# Patient Record
Sex: Male | Born: 1960 | Race: White | Hispanic: No | Marital: Married | Smoking: Current every day smoker
Health system: Southern US, Community
[De-identification: ages and names within clinical notes are randomized; demographics above are authoritative.]

## PROBLEM LIST (undated history)

## (undated) DIAGNOSIS — I10 Essential (primary) hypertension: Secondary | ICD-10-CM

## (undated) DIAGNOSIS — G8929 Other chronic pain: Secondary | ICD-10-CM

## (undated) DIAGNOSIS — F329 Major depressive disorder, single episode, unspecified: Secondary | ICD-10-CM

## (undated) DIAGNOSIS — G4700 Insomnia, unspecified: Secondary | ICD-10-CM

## (undated) DIAGNOSIS — E119 Type 2 diabetes mellitus without complications: Secondary | ICD-10-CM

## (undated) DIAGNOSIS — E785 Hyperlipidemia, unspecified: Secondary | ICD-10-CM

## (undated) DIAGNOSIS — M1990 Unspecified osteoarthritis, unspecified site: Secondary | ICD-10-CM

## (undated) DIAGNOSIS — Z8739 Personal history of other diseases of the musculoskeletal system and connective tissue: Secondary | ICD-10-CM

## (undated) DIAGNOSIS — F32A Depression, unspecified: Secondary | ICD-10-CM

## (undated) DIAGNOSIS — M549 Dorsalgia, unspecified: Secondary | ICD-10-CM

## (undated) HISTORY — PX: BACK SURGERY: SHX140

## (undated) HISTORY — PX: OTHER SURGICAL HISTORY: SHX169

---

## 2004-12-30 ENCOUNTER — Emergency Department: Payer: Self-pay | Admitting: Emergency Medicine

## 2011-07-09 ENCOUNTER — Emergency Department: Payer: Self-pay | Admitting: Emergency Medicine

## 2011-08-10 ENCOUNTER — Ambulatory Visit: Payer: Self-pay | Admitting: Unknown Physician Specialty

## 2011-08-23 ENCOUNTER — Ambulatory Visit: Payer: Self-pay | Admitting: Unknown Physician Specialty

## 2011-08-23 DIAGNOSIS — I10 Essential (primary) hypertension: Secondary | ICD-10-CM

## 2011-08-23 LAB — BASIC METABOLIC PANEL
Calcium, Total: 9 mg/dL (ref 8.5–10.1)
Co2: 29 mmol/L (ref 21–32)
EGFR (African American): >60
Potassium: 3 mmol/L — ABNORMAL LOW (ref 3.5–5.1)
Sodium: 142 mmol/L (ref 136–145)

## 2011-08-23 LAB — CBC
HCT: 48.4 % (ref 40.0–52.0)
MCH: 31.4 pg (ref 26.0–34.0)
MCHC: 33.6 g/dL (ref 32.0–36.0)
MCV: 94 fL (ref 80–100)
Platelet: 335 10*3/uL (ref 150–440)
RDW: 13.9 % (ref 11.5–14.5)
WBC: 12.2 10*3/uL — ABNORMAL HIGH (ref 3.8–10.6)

## 2011-08-23 LAB — URINALYSIS, COMPLETE
Bacteria: NONE SEEN
Nitrite: NEGATIVE
Specific Gravity: 1.004 (ref 1.003–1.030)
Squamous Epithelial: NONE SEEN
WBC UR: NONE SEEN /HPF (ref 0–5)

## 2011-08-30 ENCOUNTER — Ambulatory Visit: Payer: Self-pay | Admitting: Unknown Physician Specialty

## 2011-08-30 LAB — POTASSIUM: Potassium: 3.4 mmol/L — ABNORMAL LOW (ref 3.5–5.1)

## 2011-10-23 ENCOUNTER — Ambulatory Visit: Payer: Self-pay | Admitting: Unknown Physician Specialty

## 2012-11-09 ENCOUNTER — Ambulatory Visit: Payer: Self-pay | Admitting: Family Medicine

## 2012-11-13 ENCOUNTER — Ambulatory Visit: Payer: Self-pay | Admitting: Family Medicine

## 2012-11-13 LAB — BASIC METABOLIC PANEL
Anion Gap: 6 — ABNORMAL LOW (ref 7–16)
BUN: 9 mg/dL (ref 7–18)
EGFR (African American): >60
EGFR (Non-African Amer.): >60
Osmolality: 278 (ref 275–301)
Potassium: 3.8 mmol/L (ref 3.5–5.1)

## 2012-11-13 LAB — LIPID PANEL
Cholesterol: 142 mg/dL (ref 0–200)
Ldl Cholesterol, Calc: 74 mg/dL (ref 0–100)

## 2012-11-13 LAB — HEMOGLOBIN A1C: Hemoglobin A1C: 10.2 % — ABNORMAL HIGH (ref 4.2–6.3)

## 2012-11-17 ENCOUNTER — Ambulatory Visit: Payer: Self-pay | Admitting: Family Medicine

## 2012-12-10 ENCOUNTER — Other Ambulatory Visit: Payer: Self-pay | Admitting: Neurological Surgery

## 2012-12-10 DIAGNOSIS — M48061 Spinal stenosis, lumbar region without neurogenic claudication: Secondary | ICD-10-CM

## 2012-12-17 ENCOUNTER — Ambulatory Visit
Admission: RE | Admit: 2012-12-17 | Discharge: 2012-12-17 | Disposition: A | Discharge status: Home / Self Care | Payer: Medicare HMO | Source: Ambulatory Visit | Attending: Neurological Surgery | Admitting: Neurological Surgery

## 2012-12-17 VITALS — BP 93/46 | HR 49

## 2012-12-17 DIAGNOSIS — M48061 Spinal stenosis, lumbar region without neurogenic claudication: Secondary | ICD-10-CM

## 2012-12-17 MED ORDER — OXYCODONE-ACETAMINOPHEN 5-325 MG PO TABS
2.0000 | ORAL_TABLET | Freq: Once | ORAL | Status: AC
  Administered 2012-12-17: 2 via ORAL

## 2012-12-17 MED ORDER — IOHEXOL 180 MG/ML  SOLN
15.0000 mL | Freq: Once | INTRAMUSCULAR | Status: AC | PRN
  Administered 2012-12-17: 15 mL via INTRATHECAL

## 2012-12-17 MED ORDER — DIAZEPAM 5 MG PO TABS
10.0000 mg | ORAL_TABLET | Freq: Once | ORAL | Status: AC
  Administered 2012-12-17: 10 mg via ORAL

## 2012-12-17 NOTE — Progress Notes (Signed)
Patient states he has been off Celexa for at least the past two days. 

## 2013-01-12 ENCOUNTER — Other Ambulatory Visit: Payer: Self-pay | Admitting: Neurological Surgery

## 2013-01-13 ENCOUNTER — Encounter (HOSPITAL_COMMUNITY): Payer: Self-pay | Admitting: Pharmacy Technician

## 2013-01-20 ENCOUNTER — Encounter (HOSPITAL_COMMUNITY)
Admission: RE | Admit: 2013-01-20 | Discharge: 2013-01-20 | Disposition: A | Discharge status: Home / Self Care | Payer: Medicare HMO | Source: Ambulatory Visit | Attending: Neurological Surgery | Admitting: Neurological Surgery

## 2013-01-20 ENCOUNTER — Encounter (HOSPITAL_COMMUNITY): Payer: Self-pay

## 2013-01-20 HISTORY — DX: Insomnia, unspecified: G47.00

## 2013-01-20 HISTORY — DX: Essential (primary) hypertension: I10

## 2013-01-20 HISTORY — DX: Type 2 diabetes mellitus without complications: E11.9

## 2013-01-20 HISTORY — DX: Dorsalgia, unspecified: M54.9

## 2013-01-20 HISTORY — DX: Major depressive disorder, single episode, unspecified: F32.9

## 2013-01-20 HISTORY — DX: Depression, unspecified: F32.A

## 2013-01-20 HISTORY — DX: Other chronic pain: G89.29

## 2013-01-20 HISTORY — DX: Unspecified osteoarthritis, unspecified site: M19.90

## 2013-01-20 HISTORY — DX: Hyperlipidemia, unspecified: E78.5

## 2013-01-20 HISTORY — DX: Personal history of other diseases of the musculoskeletal system and connective tissue: Z87.39

## 2013-01-20 LAB — CBC WITH DIFFERENTIAL/PLATELET
Basophils Relative: 0 % (ref 0–1)
Eosinophils Absolute: 0 10*3/uL (ref 0.0–0.7)
Hemoglobin: 15.7 g/dL (ref 13.0–17.0)
MCH: 30 pg (ref 26.0–34.0)
MCHC: 32.6 g/dL (ref 30.0–36.0)
Monocytes Absolute: 0.7 10*3/uL (ref 0.1–1.0)
Monocytes Relative: 5 % (ref 3–12)
Neutrophils Relative %: 80 % — ABNORMAL HIGH (ref 43–77)

## 2013-01-20 LAB — SURGICAL PCR SCREEN
MRSA, PCR: NEGATIVE
Staphylococcus aureus: POSITIVE — AB

## 2013-01-20 LAB — BASIC METABOLIC PANEL
BUN: 9 mg/dL (ref 6–23)
Creatinine, Ser: 0.79 mg/dL (ref 0.50–1.35)
GFR calc non Af Amer: >90 mL/min (ref >90)
Glucose, Bld: 197 mg/dL — ABNORMAL HIGH (ref 70–99)
Potassium: 4.6 mEq/L (ref 3.5–5.1)

## 2013-01-20 LAB — TYPE AND SCREEN: ABO/RH(D): O POS

## 2013-01-20 LAB — PROTIME-INR: INR: 0.96 (ref 0.00–1.49)

## 2013-01-20 NOTE — Progress Notes (Signed)
Spoke with Erie Noe and she will call in Mupirocin to [facility] in [place]

## 2013-01-20 NOTE — Progress Notes (Signed)
Cardiologist is Dr.Kawalski at [facility] visit last yr;does see him yrly  Echo and stress test done about 6months ago-to request from [facility]  Heart cath done within last yr-to be requested from [facility]  EKG to be requested from [facility]  Denies CXR in past  yr

## 2013-01-20 NOTE — Pre-Procedure Instructions (Signed)
Richard Horn  01/20/2013   Your procedure is scheduled on:  Fri, Aug 1 @ 11:00 AM  Report to [facility] at 9:00 AM.  Call this number if you have problems the morning of surgery: [phone]   Remember:   Do not eat food or drink liquids after midnight.   Take these medicines the morning of surgery with A SIP OF WATER: Celexa(Citalopram),Gabapentin(Neurontin),and Pain Pill(if needed)   Do not wear jewelry  Do not wear lotions, powders, or colognes. You may wear deodorant.  Men may shave face and neck.  Do not bring valuables to the hospital.  [facility] is not responsible                   for any belongings or valuables.  Contacts, dentures or bridgework may not be worn into surgery.  Leave suitcase in the car. After surgery it may be brought to your room.  For patients admitted to the hospital, checkout time is 11:00 AM the day of  discharge.     Special Instructions: Shower using CHG 2 nights before surgery and the night before surgery.  If you shower the day of surgery use CHG.  Use special wash - you have one bottle of CHG for all showers.  You should use approximately 1/3 of the bottle for each shower.   Please read over the following fact sheets that you were given: Pain Booklet, Coughing and Deep Breathing, Blood Transfusion Information, MRSA Information and Surgical Site Infection Prevention

## 2013-01-21 MED ORDER — EPHEDRINE SULFATE 50 MG/ML IJ SOLN
INTRAMUSCULAR | Status: AC
  Filled 2013-01-21: qty 1

## 2013-01-21 MED ORDER — ALBUMIN HUMAN 5 % IV SOLN
INTRAVENOUS | Status: AC
  Filled 2013-01-21: qty 250

## 2013-01-21 NOTE — Progress Notes (Signed)
Anesthesia Chart Review:  Patient is a 52 year old male scheduled for L2-3 XLIF on 01/23/13 by Dr. Yetta Barre.  History includes obesity, smoking, HTN, HLD, DM2, depression, arthritis, gout, insomnia, prior neck fusion and back surgery. PCP is Dr. Yates Decamp.  He was sent to cardiologist Dr. Arnoldo Hooker [facility]) by his PCP for a preoperative evaluation last fall.  He underwent carotid duplex, stress, and echo and ultimately Dr. Gwen Pounds cleared patient for back surgery (see 02/26/12 note).    Echo on 02/26/12 [facility]) showed normal LV systolic function, EF 55%.  Moderate LVH.  Mild mitral and tricuspid insufficiency.   Nuclear stress test on 02/05/12 [facility]) showed normal Regadenoson ECG without evidence of ischemia or dysrhythmia.  Normal LV function with EF 53%.  Abnormal myocardial perfusion images consistent with diaphragmatic artifact.     The last EKG from [facility] is from 07/31/11, so he will need one on the day of surgery.    Preoperative CXR and labs noted.  I called patient to clarify history.  He could not tell me why his surgery was scheduled nearly a year after his cardiac clearance, but reports that he has had no new symptoms.  He denies known MI/CHF and PCI/CABG.  He was unsure about what a heart cath was (he had told the PAT RN that he had one), but when I questioned him further it does not sound like he has ever had one.  (There was no record of a cardiac cath at [facility] or [facility].)  He did report a prior carotid duplex (01/10/12 by notes) that showed 70% LICA stenosis and 35% RICA stenosis.  He says he is scheduled for follow-up before the end of the year.    Patient had no ischemia, normal EF, and only mild MR/TR by stress/echo within the past year.  He denies any new CV symptomology.  He will get an EKG on arrival, and if stable then I would anticipate that he could proceed as planned.  Velna Ochs [facility]/Anesthesiology Phone [phone] 01/21/2013 1:13 PM

## 2013-01-22 MED ORDER — CEFAZOLIN SODIUM-DEXTROSE 2-3 GM-% IV SOLR
2.0000 g | INTRAVENOUS | Status: AC
  Administered 2013-01-23: 2 g via INTRAVENOUS
  Filled 2013-01-22: qty 50

## 2013-01-23 ENCOUNTER — Inpatient Hospital Stay (HOSPITAL_COMMUNITY): Payer: Medicare HMO | Admitting: Anesthesiology

## 2013-01-23 ENCOUNTER — Inpatient Hospital Stay (HOSPITAL_COMMUNITY)
Admission: RE | Admit: 2013-01-23 | Discharge: 2013-01-25 | DRG: 460 | Disposition: A | Discharge status: Home / Self Care | Payer: Medicare HMO | Source: Ambulatory Visit | Attending: Neurological Surgery | Admitting: Neurological Surgery

## 2013-01-23 ENCOUNTER — Encounter (HOSPITAL_COMMUNITY): Payer: Self-pay | Admitting: Vascular Surgery

## 2013-01-23 ENCOUNTER — Inpatient Hospital Stay (HOSPITAL_COMMUNITY): Payer: Medicare HMO

## 2013-01-23 ENCOUNTER — Encounter (HOSPITAL_COMMUNITY)
Admission: RE | Disposition: A | Discharge status: Home / Self Care | Payer: Self-pay | Source: Ambulatory Visit | Attending: Neurological Surgery

## 2013-01-23 ENCOUNTER — Encounter (HOSPITAL_COMMUNITY): Payer: Self-pay | Admitting: Anesthesiology

## 2013-01-23 DIAGNOSIS — Z79899 Other long term (current) drug therapy: Secondary | ICD-10-CM

## 2013-01-23 DIAGNOSIS — E785 Hyperlipidemia, unspecified: Secondary | ICD-10-CM | POA: Diagnosis present

## 2013-01-23 DIAGNOSIS — I10 Essential (primary) hypertension: Secondary | ICD-10-CM | POA: Diagnosis present

## 2013-01-23 DIAGNOSIS — Z01812 Encounter for preprocedural laboratory examination: Secondary | ICD-10-CM

## 2013-01-23 DIAGNOSIS — F3289 Other specified depressive episodes: Secondary | ICD-10-CM | POA: Diagnosis present

## 2013-01-23 DIAGNOSIS — M5126 Other intervertebral disc displacement, lumbar region: Principal | ICD-10-CM | POA: Diagnosis present

## 2013-01-23 DIAGNOSIS — G4700 Insomnia, unspecified: Secondary | ICD-10-CM | POA: Diagnosis present

## 2013-01-23 DIAGNOSIS — F329 Major depressive disorder, single episode, unspecified: Secondary | ICD-10-CM | POA: Diagnosis present

## 2013-01-23 DIAGNOSIS — G8929 Other chronic pain: Secondary | ICD-10-CM | POA: Diagnosis present

## 2013-01-23 DIAGNOSIS — M109 Gout, unspecified: Secondary | ICD-10-CM | POA: Diagnosis present

## 2013-01-23 DIAGNOSIS — M5137 Other intervertebral disc degeneration, lumbosacral region: Secondary | ICD-10-CM | POA: Diagnosis present

## 2013-01-23 DIAGNOSIS — Z981 Arthrodesis status: Secondary | ICD-10-CM

## 2013-01-23 DIAGNOSIS — F17200 Nicotine dependence, unspecified, uncomplicated: Secondary | ICD-10-CM | POA: Diagnosis present

## 2013-01-23 DIAGNOSIS — E119 Type 2 diabetes mellitus without complications: Secondary | ICD-10-CM | POA: Diagnosis present

## 2013-01-23 DIAGNOSIS — M51379 Other intervertebral disc degeneration, lumbosacral region without mention of lumbar back pain or lower extremity pain: Secondary | ICD-10-CM | POA: Diagnosis present

## 2013-01-23 HISTORY — PX: ANTERIOR LAT LUMBAR FUSION: SHX1168

## 2013-01-23 HISTORY — PX: LUMBAR PERCUTANEOUS PEDICLE SCREW 1 LEVEL: SHX5560

## 2013-01-23 LAB — GLUCOSE, CAPILLARY
Glucose-Capillary: 113 mg/dL — ABNORMAL HIGH (ref 70–99)
Glucose-Capillary: 123 mg/dL — ABNORMAL HIGH (ref 70–99)
Glucose-Capillary: 143 mg/dL — ABNORMAL HIGH (ref 70–99)

## 2013-01-23 SURGERY — ANTERIOR LATERAL LUMBAR FUSION 1 LEVEL
Anesthesia: General | Site: Flank | Wound class: Clean

## 2013-01-23 MED ORDER — PHENOL 1.4 % MT LIQD: 1.0000 | OROMUCOSAL | Status: DC | PRN

## 2013-01-23 MED ORDER — ARTIFICIAL TEARS OP OINT
TOPICAL_OINTMENT | OPHTHALMIC | Status: DC | PRN
  Administered 2013-01-23: 1 via OPHTHALMIC

## 2013-01-23 MED ORDER — PROPOFOL INFUSION 10 MG/ML OPTIME
INTRAVENOUS | Status: DC | PRN
  Administered 2013-01-23: 50 ug/kg/min via INTRAVENOUS

## 2013-01-23 MED ORDER — INSULIN ASPART 100 UNIT/ML ~~LOC~~ SOLN
0.0000 [IU] | Freq: Three times a day (TID) | SUBCUTANEOUS | Status: DC
  Administered 2013-01-24 (×2): 2 [IU] via SUBCUTANEOUS
  Administered 2013-01-25: 1 [IU] via SUBCUTANEOUS

## 2013-01-23 MED ORDER — DEXAMETHASONE SODIUM PHOSPHATE 10 MG/ML IJ SOLN
10.0000 mg | INTRAMUSCULAR | Status: AC
  Administered 2013-01-23: 10 mg via INTRAVENOUS

## 2013-01-23 MED ORDER — CELECOXIB 200 MG PO CAPS
200.0000 mg | ORAL_CAPSULE | Freq: Two times a day (BID) | ORAL | Status: DC
  Administered 2013-01-23 – 2013-01-25 (×4): 200 mg via ORAL
  Filled 2013-01-23 (×5): qty 1

## 2013-01-23 MED ORDER — SENNA 8.6 MG PO TABS
1.0000 | ORAL_TABLET | Freq: Two times a day (BID) | ORAL | Status: DC
  Administered 2013-01-23 – 2013-01-25 (×3): 8.6 mg via ORAL
  Filled 2013-01-23 (×4): qty 1

## 2013-01-23 MED ORDER — OXYCODONE HCL 5 MG PO TABS: 5.0000 mg | ORAL_TABLET | Freq: Once | ORAL | Status: DC | PRN

## 2013-01-23 MED ORDER — LIDOCAINE HCL 4 % MT SOLN
OROMUCOSAL | Status: DC | PRN
  Administered 2013-01-23: 4 mL via TOPICAL

## 2013-01-23 MED ORDER — HYDROMORPHONE HCL PF 1 MG/ML IJ SOLN
INTRAMUSCULAR | Status: AC
  Filled 2013-01-23: qty 1

## 2013-01-23 MED ORDER — BUPIVACAINE HCL (PF) 0.25 % IJ SOLN
INTRAMUSCULAR | Status: DC | PRN
  Administered 2013-01-23: 6 mL

## 2013-01-23 MED ORDER — PROPOFOL 10 MG/ML IV BOLUS
INTRAVENOUS | Status: DC | PRN
  Administered 2013-01-23: 200 mg via INTRAVENOUS

## 2013-01-23 MED ORDER — MEPERIDINE HCL 25 MG/ML IJ SOLN: 6.2500 mg | INTRAMUSCULAR | Status: DC | PRN

## 2013-01-23 MED ORDER — EPHEDRINE SULFATE 50 MG/ML IJ SOLN
INTRAMUSCULAR | Status: DC | PRN
  Administered 2013-01-23 (×2): 5 mg via INTRAVENOUS
  Administered 2013-01-23: 10 mg via INTRAVENOUS
  Administered 2013-01-23 (×3): 5 mg via INTRAVENOUS
  Administered 2013-01-23: 10 mg via INTRAVENOUS
  Administered 2013-01-23: 5 mg via INTRAVENOUS

## 2013-01-23 MED ORDER — SUCCINYLCHOLINE CHLORIDE 20 MG/ML IJ SOLN
INTRAMUSCULAR | Status: DC | PRN
  Administered 2013-01-23: 120 mg via INTRAVENOUS

## 2013-01-23 MED ORDER — MORPHINE SULFATE 2 MG/ML IJ SOLN
1.0000 mg | INTRAMUSCULAR | Status: DC | PRN
  Administered 2013-01-23 – 2013-01-24 (×4): 2 mg via INTRAVENOUS
  Filled 2013-01-23 (×4): qty 1

## 2013-01-23 MED ORDER — 0.9 % SODIUM CHLORIDE (POUR BTL) OPTIME
TOPICAL | Status: DC | PRN
  Administered 2013-01-23: 1000 mL

## 2013-01-23 MED ORDER — ACETAMINOPHEN 10 MG/ML IV SOLN
1000.0000 mg | Freq: Once | INTRAVENOUS | Status: DC
  Filled 2013-01-23: qty 100

## 2013-01-23 MED ORDER — KETOROLAC TROMETHAMINE 30 MG/ML IJ SOLN
30.0000 mg | Freq: Four times a day (QID) | INTRAMUSCULAR | Status: DC | PRN
  Administered 2013-01-23 – 2013-01-25 (×3): 30 mg via INTRAVENOUS
  Filled 2013-01-23 (×2): qty 1

## 2013-01-23 MED ORDER — SUFENTANIL CITRATE 50 MCG/ML IV SOLN
INTRAVENOUS | Status: DC | PRN
  Administered 2013-01-23: 20 ug via INTRAVENOUS
  Administered 2013-01-23: 30 ug via INTRAVENOUS
  Administered 2013-01-23 (×3): 10 ug via INTRAVENOUS

## 2013-01-23 MED ORDER — BACITRACIN 50000 UNITS IM SOLR
INTRAMUSCULAR | Status: AC
  Filled 2013-01-23: qty 1

## 2013-01-23 MED ORDER — ONDANSETRON HCL 4 MG/2ML IJ SOLN: 4.0000 mg | INTRAMUSCULAR | Status: DC | PRN

## 2013-01-23 MED ORDER — SODIUM CHLORIDE 0.9 % IV SOLN: 250.0000 mL | INTRAVENOUS | Status: DC

## 2013-01-23 MED ORDER — SODIUM CHLORIDE 0.9 % IJ SOLN: 3.0000 mL | INTRAMUSCULAR | Status: DC | PRN

## 2013-01-23 MED ORDER — CITALOPRAM HYDROBROMIDE 40 MG PO TABS
40.0000 mg | ORAL_TABLET | Freq: Every day | ORAL | Status: DC
  Administered 2013-01-23 – 2013-01-25 (×3): 40 mg via ORAL
  Filled 2013-01-23 (×3): qty 1

## 2013-01-23 MED ORDER — SODIUM CHLORIDE 0.9 % IV SOLN
INTRAVENOUS | Status: AC
  Filled 2013-01-23: qty 500

## 2013-01-23 MED ORDER — MIDAZOLAM HCL 2 MG/2ML IJ SOLN
0.5000 mg | Freq: Once | INTRAMUSCULAR | Status: AC | PRN
  Administered 2013-01-23: 0.5 mg via INTRAVENOUS

## 2013-01-23 MED ORDER — HYDROMORPHONE HCL PF 1 MG/ML IJ SOLN: 0.5000 mg | INTRAMUSCULAR | Status: DC | PRN

## 2013-01-23 MED ORDER — GABAPENTIN 800 MG PO TABS
800.0000 mg | ORAL_TABLET | Freq: Three times a day (TID) | ORAL | Status: DC
  Filled 2013-01-23 (×2): qty 1

## 2013-01-23 MED ORDER — PROMETHAZINE HCL 25 MG/ML IJ SOLN: 6.2500 mg | INTRAMUSCULAR | Status: DC | PRN

## 2013-01-23 MED ORDER — ACETAMINOPHEN 10 MG/ML IV SOLN
INTRAVENOUS | Status: AC
  Administered 2013-01-23: 1000 mg via INTRAVENOUS
  Filled 2013-01-23: qty 100

## 2013-01-23 MED ORDER — MIDAZOLAM HCL 2 MG/2ML IJ SOLN
INTRAMUSCULAR | Status: AC
  Filled 2013-01-23: qty 2

## 2013-01-23 MED ORDER — GLYCOPYRROLATE 0.2 MG/ML IJ SOLN
INTRAMUSCULAR | Status: DC | PRN
  Administered 2013-01-23: 0.2 mg via INTRAVENOUS

## 2013-01-23 MED ORDER — KETOROLAC TROMETHAMINE 30 MG/ML IJ SOLN
INTRAMUSCULAR | Status: AC
  Filled 2013-01-23: qty 1

## 2013-01-23 MED ORDER — OXYCODONE HCL 5 MG/5ML PO SOLN: 5.0000 mg | Freq: Once | ORAL | Status: DC | PRN

## 2013-01-23 MED ORDER — OXYCODONE HCL 5 MG PO TABS
15.0000 mg | ORAL_TABLET | ORAL | Status: DC | PRN
  Administered 2013-01-23 – 2013-01-25 (×5): 15 mg via ORAL
  Filled 2013-01-23: qty 2
  Filled 2013-01-23: qty 3
  Filled 2013-01-23: qty 1
  Filled 2013-01-23 (×2): qty 3

## 2013-01-23 MED ORDER — LISINOPRIL 20 MG PO TABS
20.0000 mg | ORAL_TABLET | Freq: Every day | ORAL | Status: DC
  Administered 2013-01-24 – 2013-01-25 (×2): 20 mg via ORAL
  Filled 2013-01-23 (×2): qty 1

## 2013-01-23 MED ORDER — GABAPENTIN 400 MG PO CAPS
800.0000 mg | ORAL_CAPSULE | Freq: Three times a day (TID) | ORAL | Status: DC
  Administered 2013-01-24: 800 mg via ORAL
  Filled 2013-01-23 (×5): qty 2

## 2013-01-23 MED ORDER — CEFAZOLIN SODIUM 1-5 GM-% IV SOLN
1.0000 g | Freq: Three times a day (TID) | INTRAVENOUS | Status: AC
  Administered 2013-01-23 – 2013-01-24 (×2): 1 g via INTRAVENOUS
  Filled 2013-01-23 (×2): qty 50

## 2013-01-23 MED ORDER — MIDAZOLAM HCL 5 MG/5ML IJ SOLN
INTRAMUSCULAR | Status: DC | PRN
  Administered 2013-01-23: 2 mg via INTRAVENOUS

## 2013-01-23 MED ORDER — DEXAMETHASONE SODIUM PHOSPHATE 10 MG/ML IJ SOLN
INTRAMUSCULAR | Status: AC
  Filled 2013-01-23: qty 1

## 2013-01-23 MED ORDER — HYDROCHLOROTHIAZIDE 12.5 MG PO CAPS
12.5000 mg | ORAL_CAPSULE | Freq: Every day | ORAL | Status: DC
  Administered 2013-01-24 – 2013-01-25 (×2): 12.5 mg via ORAL
  Filled 2013-01-23 (×2): qty 1

## 2013-01-23 MED ORDER — LACTATED RINGERS IV SOLN
INTRAVENOUS | Status: DC | PRN
  Administered 2013-01-23 (×2): via INTRAVENOUS

## 2013-01-23 MED ORDER — DEXAMETHASONE SODIUM PHOSPHATE 4 MG/ML IJ SOLN
4.0000 mg | Freq: Four times a day (QID) | INTRAMUSCULAR | Status: DC
  Filled 2013-01-23 (×8): qty 1

## 2013-01-23 MED ORDER — HYDROMORPHONE HCL PF 1 MG/ML IJ SOLN
0.2500 mg | INTRAMUSCULAR | Status: DC | PRN
  Administered 2013-01-23 (×2): 0.5 mg via INTRAVENOUS
  Administered 2013-01-23 (×2): 1 mg via INTRAVENOUS

## 2013-01-23 MED ORDER — ONDANSETRON HCL 4 MG/2ML IJ SOLN
INTRAMUSCULAR | Status: DC | PRN
  Administered 2013-01-23: 4 mg via INTRAVENOUS

## 2013-01-23 MED ORDER — GABAPENTIN 400 MG PO CAPS
800.0000 mg | ORAL_CAPSULE | Freq: Three times a day (TID) | ORAL | Status: DC
  Administered 2013-01-23 – 2013-01-25 (×4): 800 mg via ORAL
  Filled 2013-01-23 (×8): qty 2

## 2013-01-23 MED ORDER — LIDOCAINE HCL (CARDIAC) 20 MG/ML IV SOLN
INTRAVENOUS | Status: DC | PRN
  Administered 2013-01-23: 20 mg via INTRAVENOUS

## 2013-01-23 MED ORDER — POTASSIUM CHLORIDE IN NACL 20-0.9 MEQ/L-% IV SOLN
INTRAVENOUS | Status: DC
  Administered 2013-01-23: 22:00:00 via INTRAVENOUS
  Filled 2013-01-23 (×5): qty 1000

## 2013-01-23 MED ORDER — DEXAMETHASONE 4 MG PO TABS
4.0000 mg | ORAL_TABLET | Freq: Four times a day (QID) | ORAL | Status: DC
  Administered 2013-01-23 – 2013-01-25 (×7): 4 mg via ORAL
  Filled 2013-01-23 (×11): qty 1

## 2013-01-23 MED ORDER — METFORMIN HCL ER 500 MG PO TB24
500.0000 mg | ORAL_TABLET | Freq: Every day | ORAL | Status: DC
  Administered 2013-01-24 – 2013-01-25 (×2): 500 mg via ORAL
  Filled 2013-01-23 (×3): qty 1

## 2013-01-23 MED ORDER — SODIUM CHLORIDE 0.9 % IJ SOLN
3.0000 mL | Freq: Two times a day (BID) | INTRAMUSCULAR | Status: DC
Start: 2013-01-23 — End: 2013-01-25
  Administered 2013-01-23 – 2013-01-24 (×2): 3 mL via INTRAVENOUS

## 2013-01-23 MED ORDER — SODIUM CHLORIDE 0.9 % IR SOLN
Status: DC | PRN
  Administered 2013-01-23: 12:00:00

## 2013-01-23 MED ORDER — LISINOPRIL-HYDROCHLOROTHIAZIDE 20-12.5 MG PO TABS
1.0000 | ORAL_TABLET | Freq: Every day | ORAL | Status: DC
Start: 2013-01-23 — End: 2013-01-23

## 2013-01-23 MED ORDER — ACETAMINOPHEN 325 MG PO TABS: 650.0000 mg | ORAL_TABLET | ORAL | Status: DC | PRN

## 2013-01-23 MED ORDER — ACETAMINOPHEN 650 MG RE SUPP: 650.0000 mg | RECTAL | Status: DC | PRN

## 2013-01-23 MED ORDER — LACTATED RINGERS IV SOLN
INTRAVENOUS | Status: DC
  Administered 2013-01-23 (×2): via INTRAVENOUS

## 2013-01-23 MED ORDER — ALPRAZOLAM 0.25 MG PO TABS: 0.2500 mg | ORAL_TABLET | Freq: Every evening | ORAL | Status: DC | PRN

## 2013-01-23 MED ORDER — MENTHOL 3 MG MT LOZG: 1.0000 | LOZENGE | OROMUCOSAL | Status: DC | PRN

## 2013-01-23 MED ORDER — OXYCODONE HCL 5 MG PO TABS
ORAL_TABLET | ORAL | Status: AC
  Filled 2013-01-23: qty 3

## 2013-01-23 SURGICAL SUPPLY — 74 items
BAG DECANTER FOR FLEXI CONT (MISCELLANEOUS) ×3 IMPLANT
BENZOIN TINCTURE PRP APPL 2/3 (GAUZE/BANDAGES/DRESSINGS) IMPLANT
BLADE SURG ROTATE 9660 (MISCELLANEOUS) IMPLANT
BONE MATRIX OSTEOCEL PRO MED (Bone Implant) ×3 IMPLANT
CLOTH BEACON ORANGE TIMEOUT ST (SAFETY) ×3 IMPLANT
CONT SPEC 4OZ CLIKSEAL STRL BL (MISCELLANEOUS) IMPLANT
COVER BACK TABLE 24X17X13 BIG (DRAPES) IMPLANT
COVER TABLE BACK 60X90 (DRAPES) ×3 IMPLANT
DERMABOND ADHESIVE PROPEN (GAUZE/BANDAGES/DRESSINGS) ×2
DERMABOND ADVANCED (GAUZE/BANDAGES/DRESSINGS)
DERMABOND ADVANCED .7 DNX12 (GAUZE/BANDAGES/DRESSINGS) IMPLANT
DERMABOND ADVANCED .7 DNX6 (GAUZE/BANDAGES/DRESSINGS) ×4 IMPLANT
DRAPE C-ARM 42X72 X-RAY (DRAPES) ×3 IMPLANT
DRAPE C-ARMOR (DRAPES) ×3 IMPLANT
DRAPE LAPAROTOMY 100X72X124 (DRAPES) ×3 IMPLANT
DRAPE POUCH INSTRU U-SHP 10X18 (DRAPES) ×3 IMPLANT
DRAPE SURG 17X23 STRL (DRAPES) ×12 IMPLANT
DRESSING TELFA 8X3 (GAUZE/BANDAGES/DRESSINGS) IMPLANT
DRSG OPSITE 4X5.5 SM (GAUZE/BANDAGES/DRESSINGS) IMPLANT
DURAPREP 26ML APPLICATOR (WOUND CARE) ×3 IMPLANT
ELECT BLADE 4.0 EZ CLEAN MEGAD (MISCELLANEOUS) ×3
ELECT REM PT RETURN 9FT ADLT (ELECTROSURGICAL) ×3
ELECTRODE BLDE 4.0 EZ CLN MEGD (MISCELLANEOUS) ×2 IMPLANT
ELECTRODE REM PT RTRN 9FT ADLT (ELECTROSURGICAL) ×2 IMPLANT
EVACUATOR 1/8 PVC DRAIN (DRAIN) IMPLANT
GAUZE SPONGE 4X4 16PLY XRAY LF (GAUZE/BANDAGES/DRESSINGS) IMPLANT
GLOVE BIO SURGEON STRL SZ8 (GLOVE) ×3 IMPLANT
GLOVE BIO SURGEON STRL SZ8.5 (GLOVE) ×3 IMPLANT
GLOVE BIOGEL PI IND STRL 8 (GLOVE) ×2 IMPLANT
GLOVE BIOGEL PI IND STRL 8.5 (GLOVE) ×2 IMPLANT
GLOVE BIOGEL PI INDICATOR 8 (GLOVE) ×1
GLOVE BIOGEL PI INDICATOR 8.5 (GLOVE) ×1
GLOVE ECLIPSE 7.5 STRL STRAW (GLOVE) ×9 IMPLANT
GLOVE SS BIOGEL STRL SZ 8 (GLOVE) ×2 IMPLANT
GLOVE SUPERSENSE BIOGEL SZ 8 (GLOVE) ×1
GLOVE SURG SS PI 8.0 STRL IVOR (GLOVE) ×6 IMPLANT
GOWN BRE IMP SLV AUR LG STRL (GOWN DISPOSABLE) IMPLANT
GOWN BRE IMP SLV AUR XL STRL (GOWN DISPOSABLE) ×6 IMPLANT
GOWN STRL REIN 2XL LVL4 (GOWN DISPOSABLE) ×6 IMPLANT
GUIDEWIRE NITINOL BEVEL TIP (WIRE) ×3 IMPLANT
HEMOSTAT POWDER KIT SURGIFOAM (HEMOSTASIS) IMPLANT
IMPLANT COROENT XLW 8X22X55 (Orthopedic Implant) ×3 IMPLANT
KIT BASIN OR (CUSTOM PROCEDURE TRAY) ×3 IMPLANT
KIT DILATOR XLIF 5 (KITS) ×2 IMPLANT
KIT MAXCESS (KITS) ×3 IMPLANT
KIT NEEDLE NVM5 EMG ELECT (KITS) ×2 IMPLANT
KIT NEEDLE NVM5 EMG ELECTRODE (KITS) ×1
KIT ROOM TURNOVER OR (KITS) ×3 IMPLANT
KIT XLIF (KITS) ×1
NEEDLE HYPO 22GX1.5 SAFETY (NEEDLE) ×3 IMPLANT
NEEDLE HYPO 25X1 1.5 SAFETY (NEEDLE) IMPLANT
NEEDLE I-PASS III (NEEDLE) ×3 IMPLANT
NS IRRIG 1000ML POUR BTL (IV SOLUTION) ×3 IMPLANT
PACK LAMINECTOMY NEURO (CUSTOM PROCEDURE TRAY) ×3 IMPLANT
ROD 45MM (Rod) ×3 IMPLANT
SCREW PRECEPT 6.5X45 (Screw) ×6 IMPLANT
SCREW PRECEPT SET (Screw) ×6 IMPLANT
SPONGE GAUZE 4X4 12PLY (GAUZE/BANDAGES/DRESSINGS) IMPLANT
SPONGE LAP 4X18 X RAY DECT (DISPOSABLE) IMPLANT
SPONGE SURGIFOAM ABS GEL SZ50 (HEMOSTASIS) IMPLANT
STAPLER SKIN PROX WIDE 3.9 (STAPLE) ×3 IMPLANT
STRIP CLOSURE SKIN 1/2X4 (GAUZE/BANDAGES/DRESSINGS) IMPLANT
SUT VIC AB 0 CT1 18XCR BRD8 (SUTURE) ×4 IMPLANT
SUT VIC AB 0 CT1 8-18 (SUTURE) ×2
SUT VIC AB 2-0 CP2 18 (SUTURE) ×6 IMPLANT
SUT VIC AB 3-0 SH 8-18 (SUTURE) ×6 IMPLANT
SWABSTICK BENZOIN STERILE (MISCELLANEOUS) IMPLANT
SYR 20ML ECCENTRIC (SYRINGE) ×3 IMPLANT
TAPE CLOTH 3X10 TAN LF (GAUZE/BANDAGES/DRESSINGS) ×6 IMPLANT
TOWEL OR 17X24 6PK STRL BLUE (TOWEL DISPOSABLE) IMPLANT
TOWEL OR 17X26 10 PK STRL BLUE (TOWEL DISPOSABLE) ×6 IMPLANT
TRAY FOLEY CATH 14FRSI W/METER (CATHETERS) IMPLANT
TRAY FOLEY CATH 16FRSI W/METER (SET/KITS/TRAYS/PACK) ×3 IMPLANT
WATER STERILE IRR 1000ML POUR (IV SOLUTION) ×3 IMPLANT

## 2013-01-23 NOTE — Anesthesia Postprocedure Evaluation (Signed)
  Anesthesia Post-op Note  Patient: Richard Horn  Procedure(s) Performed: Procedure(s): Lumbar two-three Extreme Lumbar Interbody Fusion with Percutaneous Pedicle Screws (N/A) LUMBAR PERCUTANEOUS PEDICLE SCREW 1 LEVEL (N/A)  Patient Location: PACU  Anesthesia Type:General  Level of Consciousness: awake  Airway and Oxygen Therapy: Patient Spontanous Breathing  Post-op Pain: mild  Post-op Assessment: Post-op Vital signs reviewed  Post-op Vital Signs: Reviewed  Complications: No apparent anesthesia complications

## 2013-01-23 NOTE — H&P (Signed)
Subjective: Patient is a 52 y.o. male admitted for XLIF L2-3. Onset of symptoms was 2 years ago, gradually worsening since that time.  The pain is rated intense, and is located at the across the lower back and radiates to legs. The pain is described as aching and occurs all day. The symptoms have been progressive. Symptoms are exacerbated by exercise and standing. The patient has undergone 2 previous laminectomies at L2-3 and other surgeons. MRI or CT showed recurrent stenosis L2-3.  Past Medical History  Diagnosis Date  . Hypertension     takes Prinzide daily  . Hyperlipidemia     takes Atorvastatin daily  . Arthritis   . Chronic back pain   . History of gout     no meds required  . Diabetes mellitus without complication     takes Metformin daily  . Depression     takes Celexa daily  . Insomnia     takes Xanax prn    Past Surgical History  Procedure Laterality Date  . Neck fusion    . Back surgery       x 3    Prior to Admission medications   Medication Sig Start Date End Date Taking? Authorizing Provider  ALPRAZolam Prudy Feeler) 0.25 MG tablet Take 0.25 mg by mouth at bedtime as needed for sleep.   Yes Historical Provider, MD  atorvastatin (LIPITOR) 10 MG tablet Take 10 mg by mouth daily.   Yes Historical Provider, MD  citalopram (CELEXA) 40 MG tablet Take 40 mg by mouth daily.   Yes Historical Provider, MD  gabapentin (NEURONTIN) 800 MG tablet Take 800 mg by mouth 3 (three) times daily.   Yes Historical Provider, MD  lisinopril-hydrochlorothiazide (PRINZIDE,ZESTORETIC) 20-12.5 MG per tablet Take 1 tablet by mouth daily.   Yes Historical Provider, MD  metFORMIN (GLUCOPHAGE-XR) 500 MG 24 hr tablet Take 500 mg by mouth daily with breakfast.   Yes Historical Provider, MD  oxyCODONE (OXYCONTIN) 10 MG 12 hr tablet Take 10 mg by mouth 6 (six) times daily.   Yes Historical Provider, MD   Allergies  Allergen Reactions  . Adhesive (Tape) Rash    With duragesic patches   . Codeine Hives   . Hydrocodone-Acetaminophen Hives    History  Substance Use Topics  . Smoking status: Current Every Day Smoker -- 1.00 packs/day for 40 years  . Smokeless tobacco: Not on file  . Alcohol Use: No    History reviewed. No pertinent family history.   Review of Systems  Positive ROS: neg  All other systems have been reviewed and were otherwise negative with the exception of those mentioned in the HPI and as above.  Objective: Vital signs in last 24 hours: Temp:  [97.9 F (36.6 C)] 97.9 F (36.6 C) (08/01 0922) Pulse Rate:  [53] 53 (08/01 0922) Resp:  [22] 22 (08/01 0922) BP: (123)/(58) 123/58 mmHg (08/01 0922) SpO2:  [94 %] 94 % (08/01 0922)  General Appearance: Alert, cooperative, no distress, appears stated age Head: Normocephalic, without obvious abnormality, atraumatic Eyes: PERRL, conjunctiva/corneas clear, EOM's intact    Neck: Supple, symmetrical, trachea midline Back: Symmetric, no curvature, ROM normal, no CVA tenderness Lungs:  respirations unlabored Heart: Regular rate and rhythm Abdomen: Soft, non-tender Extremities: Extremities normal, atraumatic, no cyanosis or edema Pulses: 2+ and symmetric all extremities Skin: Skin color, texture, turgor normal, no rashes or lesions  NEUROLOGIC:   Mental status: Alert and oriented x4,  no aphasia, good attention span, fund of knowledge, and memory  Motor Exam - grossly normal Sensory Exam - grossly normal Reflexes: trace Coordination - grossly normal Gait - grossly normal Balance - grossly normal Cranial Nerves: I: smell Not tested  II: visual acuity  OS: nl    OD: nl  II: visual fields Full to confrontation  II: pupils Equal, round, reactive to light  III,VII: ptosis None  III,IV,VI: extraocular muscles  Full ROM  V: mastication Normal  V: facial light touch sensation  Normal  V,VII: corneal reflex  Present  VII: facial muscle function - upper  Normal  VII: facial muscle function - lower Normal  VIII: hearing  Not tested  IX: soft palate elevation  Normal  IX,X: gag reflex Present  XI: trapezius strength  5/5  XI: sternocleidomastoid strength 5/5  XI: neck flexion strength  5/5  XII: tongue strength  Normal    Data Review Lab Results  Component Value Date   WBC 13.2* 01/20/2013   HGB 15.7 01/20/2013   HCT 48.2 01/20/2013   MCV 92.0 01/20/2013   PLT 307 01/20/2013   Lab Results  Component Value Date   NA 137 01/20/2013   K 4.6 01/20/2013   CL 99 01/20/2013   CO2 29 01/20/2013   BUN 9 01/20/2013   CREATININE 0.79 01/20/2013   GLUCOSE 197* 01/20/2013   Lab Results  Component Value Date   INR 0.96 01/20/2013    Assessment/Plan: Patient admitted for XLIF L2-3. Patient has failed a reasonable attempt at conservative therapy.  I explained the condition and procedure to the patient and answered any questions.  Patient wishes to proceed with procedure as planned. Understands risks/ benefits and typical outcomes of procedure.   Lashena Signer S 01/23/2013 10:38 AM

## 2013-01-23 NOTE — Op Note (Signed)
01/23/2013  1:53 PM  PATIENT:  Richard Horn  52 y.o. 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 Anterolateral retroperitoneal interbody fusion L2-3 utilizing a peek interbody cage packed with morcellized allograft, 2. Nonsegmental fixation utilizing percutaneous pedicle screws  SURGEON:  Marikay Alar, MD  ASSISTANTS: none  ANESTHESIA:   General  EBL: 75 ml  Total I/O In: 2250 [I.V.:2250] Out: 575 [Urine:500; Blood:75]  BLOOD ADMINISTERED:none  DRAINS: none   SPECIMEN:  No Specimen  INDICATION FOR PROCEDURE: This patient underwent 2 previous laminectomies at L2-3 at other institutions. He presented with severe back pain and leg pain. CT myelogram showed instability at L2-3 with recurrent or residual stenosis. I recommended a decompression and fusion. Patient understood the risks, benefits, and alternatives and potential outcomes and wished to proceed.  PROCEDURE DETAILS: The patient was brought to the operating room and after induction of adequate generalized endotracheal anesthesia, the patient was positioned in the right lateral decubitus position exposing the left side. The patient was positioned in the typical XLIF fashion and taped into position and trajectories were confirmed with AP lateral fluoroscopy. The skin was cleaned and then prepped with DuraPrep and draped in the usual sterile fashion. 5 cc of local anesthesia was injected. A lateral incision was made over the appropriate disc space and a incision was made posterior lateral to this. Blunt finger dissection was used to enter the retroperitoneal space. I could palpate the psoas musculature as well as the anterior face of the transverse process, and I could feel the fatty tissues of the retroperitoneum. I swept my finger up to the lateral incision and  passed my first dilator down to psoas musculature utilizing my index finger. We then used EMG monitoring to monitor our dilator. We checked our position with fluoroscopy and then placed a K wire into the disc space, and then used sequential dilators until our final retractor was in place. We then positioned it utilizing AP lateral fluoroscopy, and used our ball probe to make sure there were no neural structures within the working channel. I then placed the intradiscal shim, and opened my retractor further. The annulus was then incised and the discectomy was done with pituitaries. The annulus was removed from the endplates with a Cobb and the opposite annulus was opened and released. I then used a series of scrapers and shavers to prepare the endplates, taking care not to violate the endplates. I then placed my 16 mm paddle across the disc space to further open opposite annulus and I checked the trajectory with AP and lateral fluoroscopy. I then used sequential trials to determine the correct size cage. The 8 standard trial fit the best, so a corresponding cage measuring 8 x 22 x 55 mm was selected and packed with morcellized allograft. Using sliders it was then tapped gently into the disc space utilizing AP fluoroscopy until it was appropriately positioned. I then irrigated with saline solution containing bacitracin and dried any bleeding points. I checked my final construct with AP lateral fluoroscopy, removed my retractor, and closed the incisions with layers of 0 Vicryl in the fascia, 2-0 Vicryl in the subcutaneous tissues, and 3-0 Vicryl in the subcuticular tissues. The skin was closed with Dermabond. AP and lateral fluoroscopy was used to determine landmarks for placement of the percutaneous pedicle screws. Stab incisions were made over  the pedicles and a Jamshidi needle was passed down to identify the transverse process and the pedicle screw entry zone. Utilizing AP fluoroscopy, we started the needle at the  lateral border of the pedicle and tapped it into the pedicle into it reached the medial border of the pedicle on the AP image. EMG monitoring was used. We then checked our lateral fluoroscopy to assure our needle was in good position, and then passed our K wire into the vertebral body. We did this for each pedicle in the same way. We were then able to tap over each K wire, measured the correct length of our screws, and then place our pedicle screws over the K wires until they were in correct position. We then passed our rod through the towers and locked into position with locking caps and antitorque device. We then checked our final construct with AP lateral fluoroscopy. Then irrigated with saline solution containing bacitracin and dried all bleeding points. The wounds were then closed in layers of 0 Vicryl in the fascia, 2-0 Vicryl in the subcutaneous tissues, and 3-0 Vicryl in the subcuticular tissues. The skin was closed with Dermabond.  the patient was awakened from general anesthesia and transferred to the recovery in stable condition. At the end of the procedure all sponge, needle and instrument counts were correct.   PLAN OF CARE: Admit to inpatient   PATIENT DISPOSITION:  PACU - hemodynamically stable.   Delay start of Pharmacological VTE agent (>24hrs) due to surgical blood loss or risk of bleeding:  yes

## 2013-01-23 NOTE — Preoperative (Signed)
Beta Blockers   Reason not to administer Beta Blockers:Not Applicable 

## 2013-01-23 NOTE — Transfer of Care (Signed)
Immediate Anesthesia Transfer of Care Note  Patient: Richard Horn  Procedure(s) Performed: Procedure(s): Lumbar two-three Extreme Lumbar Interbody Fusion with Percutaneous Pedicle Screws (N/A) LUMBAR PERCUTANEOUS PEDICLE SCREW 1 LEVEL (N/A)  Patient Location: PACU  Anesthesia Type:General  Level of Consciousness: awake, alert , oriented and patient cooperative  Airway & Oxygen Therapy: Patient Spontanous Breathing and Patient connected to face mask oxygen  Post-op Assessment: Report given to PACU RN and Post -op Vital signs reviewed and stable  Post vital signs: Reviewed and stable  Complications: No apparent anesthesia complications

## 2013-01-23 NOTE — Anesthesia Preprocedure Evaluation (Addendum)
Anesthesia Evaluation  Patient identified by MRN, date of birth, ID band Patient awake    Reviewed: Allergy & Precautions, H&P , NPO status , Patient's Chart, lab work & pertinent test results  History of Anesthesia Complications Negative for: history of anesthetic complications  Airway Mallampati: I TM Distance: >3 FB Neck ROM: Full    Dental  (+) Edentulous Upper, Edentulous Lower and Dental Advisory Given   Pulmonary Current Smoker,  breath sounds clear to auscultation  Pulmonary exam normal       Cardiovascular hypertension, Pt. on medications Rhythm:Regular Rate:Normal     Neuro/Psych Depression Chronic back pain: narcotics daily    GI/Hepatic negative GI ROS, Neg liver ROS, '13 stress test: EF 53%, no ischemia,  '13 ECHO: EF 55%, mild MR, normal LVF   Endo/Other  diabetes (glu 113), Well Controlled, Type 2, Oral Hypoglycemic AgentsMorbid obesity  Renal/GU negative Renal ROS     Musculoskeletal   Abdominal   Peds  Hematology negative hematology ROS (+)   Anesthesia Other Findings   Reproductive/Obstetrics negative OB ROS                        Anesthesia Physical Anesthesia Plan  ASA: III  Anesthesia Plan: General   Post-op Pain Management:    Induction: Intravenous  Airway Management Planned: Oral ETT  Additional Equipment:   Intra-op Plan:   Post-operative Plan: Extubation in OR  Informed Consent: I have reviewed the patients History and Physical, chart, labs and discussed the procedure including the risks, benefits and alternatives for the proposed anesthesia with the patient or authorized representative who has indicated his/her understanding and acceptance.     Plan Discussed with: CRNA and Surgeon  Anesthesia Plan Comments: (Plan routine monitors, GETA)        Anesthesia Quick Evaluation

## 2013-01-23 NOTE — Anesthesia Procedure Notes (Signed)
Procedure Name: Intubation Date/Time: 01/23/2013 11:29 AM Performed by: Lovie Chol Pre-anesthesia Checklist: Patient identified, Emergency Drugs available, Suction available, Patient being monitored and Timeout performed Patient Re-evaluated:Patient Re-evaluated prior to inductionOxygen Delivery Method: Circle system utilized Preoxygenation: Pre-oxygenation with 100% oxygen Intubation Type: IV induction Ventilation: Mask ventilation without difficulty Laryngoscope Size: Miller and 2 Grade View: Grade I Tube type: Oral Tube size: 7.5 mm Number of attempts: 1 Airway Equipment and Method: Stylet and LTA kit utilized Placement Confirmation: ETT inserted through vocal cords under direct vision,  positive ETCO2,  CO2 detector and breath sounds checked- equal and bilateral Secured at: 22 cm Tube secured with: Tape Dental Injury: Teeth and Oropharynx as per pre-operative assessment

## 2013-01-23 NOTE — Progress Notes (Signed)
Pt received from PACU per stretcher. Transferred to bed. VS done, oriented to room. Alert/oriented but sleepy pain rated as an "8" - falls back to sleep immediately when not stimulated

## 2013-01-24 LAB — GLUCOSE, CAPILLARY
Glucose-Capillary: 178 mg/dL — ABNORMAL HIGH (ref 70–99)
Glucose-Capillary: 96 mg/dL (ref 70–99)

## 2013-01-24 NOTE — Progress Notes (Signed)
Patient ID: Richard Horn, male   DOB: 04-20-1961, 52 y.o.   MRN: [id number] Afeb, vss Complains of incisional back pain. \Neuro stable. Will start to increase activity. Home when he feels ready.

## 2013-01-24 NOTE — Evaluation (Addendum)
Physical Therapy Evaluation Patient Details Name: Richard Horn MRN: [id number] DOB: 20-Mar-1961 Today's Date: 01/24/2013 Time: 4098-1191 PT Time Calculation (min): 14 min  PT Assessment / Plan / Recommendation History of Present Illness  s/p ARIF L2-L3; has had 3 previous back surgeries   Clinical Impression  Patient is s/p ARIF L2-L3surgery resulting in functional limitations due to the deficits listed below (see PT Problem List). Patient will benefit from skilled PT to increase their independence and safety with mobility to allow discharge to the venue listed below. Pt very motivated to return home. Anticipate pt to progress steady and D/C home with family upon acute D/C.     PT Assessment  Patient needs continued PT services    Follow Up Recommendations  No PT follow up;Supervision - Intermittent;Supervision for mobility/OOB    Does the patient have the potential to tolerate intense rehabilitation      Barriers to Discharge        Equipment Recommendations  None recommended by PT    Recommendations for Other Services OT consult   Frequency Min 5X/week    Precautions / Restrictions Precautions Precautions: Back Required Braces or Orthoses: Spinal Brace Spinal Brace: Lumbar corset;Applied in sitting position Restrictions Pt able to recall 2/3 back precautions from previous surgeries; reviewed back precautions handout with pt  Weight Bearing Restrictions: No   Pertinent Vitals/Pain 8/10; RN notified that pt requests pain medicine.       Mobility  Bed Mobility Bed Mobility: Rolling Left;Left Sidelying to Sit Rolling Left: 4: Min guard;With rail Left Sidelying to Sit: HOB flat;With rails;4: Min assist Details for Bed Mobility Assistance: vc's and (A) to initiate and facilitate log rolling technique; pt relies heavily on hand rails Transfers Transfers: Sit to Stand;Stand to Sit Sit to Stand: 5: Supervision;From bed Stand to Sit: 5: Supervision;To bed Details for Transfer  Assistance: supervision for safety and cues to maintain back precautions  Ambulation/Gait Ambulation/Gait Assistance: 5: Supervision Ambulation Distance (Feet): 120 Feet Assistive device: None Ambulation/Gait Assistance Details: pt can be impulsive at times; supervision for safety; pt has tendency to scissor amb and become unsteady  Gait Pattern: Decreased stride length;Narrow base of support;Scissoring Stairs: No Wheelchair Mobility Wheelchair Mobility: No    Exercises     PT Diagnosis: Acute pain;Difficulty walking  PT Problem List: Decreased mobility;Decreased knowledge of precautions;Pain PT Treatment Interventions: Gait training;Stair training;DME instruction;Functional mobility training;Therapeutic activities;Balance training;Neuromuscular re-education;Patient/family education     PT Goals(Current goals can be found in the care plan section) Acute Rehab PT Goals PT Goal Formulation: With family Time For Goal Achievement: 01/31/13 Potential to Achieve Goals: Good  Visit Information  Last PT Received On: 01/24/13 Assistance Needed: +1 History of Present Illness: s/p ARIF L2-L3; has had 3 previous back surgeries        Prior Functioning  Home Living Family/patient expects to be discharged to:: Private residence Living Arrangements: Parent Available Help at Discharge: Family;Available 24 hours/day Type of Home: House Home Access: Stairs to enter [employer] of Steps: 2 Entrance Stairs-Rails: Can reach both Home Layout: One level Home Equipment: Walker - 2 wheels;Cane - single point;Shower seat;Other (comment) (handicap height toilet; ) Prior Function Level of Independence: Independent with assistive device(s) Communication Communication: HOH    Cognition  Cognition Arousal/Alertness: Awake/alert Behavior During Therapy: WFL for tasks assessed/performed Overall Cognitive Status: Within Functional Limits for tasks assessed    Extremity/Trunk Assessment  Upper Extremity Assessment Upper Extremity Assessment: Defer to OT evaluation Lower Extremity Assessment Lower Extremity Assessment: Overall [facility]  for tasks assessed Cervical / Trunk Assessment Cervical / Trunk Assessment: Kyphotic   Balance    End of Session PT - End of Session Equipment Utilized During Treatment: Back brace Activity Tolerance: Patient tolerated treatment well Patient left: in chair;with call bell/phone within reach Nurse Communication: Mobility status  GP     Donell Sievert, [place] [phone] 01/24/2013, 1:41 PM

## 2013-01-24 NOTE — Progress Notes (Signed)
Occupational Therapy Evaluation Patient Details Name: Richard Horn MRN: [id number] DOB: Apr 20, 1961 Today's Date: 01/24/2013 Time: 4098-1191 OT Time Calculation (min): 24 min  OT Assessment / Plan / Recommendation History of present illness s/p ARIF L2-L3; has had 3 previous back surgeries    Clinical Impression   Completed education on back precautions and ADL with use of DME and AE. Pt states that he has never had OT with his previous surgeries and how helpful the information was. Pt is mod I with LB ADL with use of AE. Pt given written handout. No further OT needed. Ready for D/C tomorrow if medically stable. Thank you.    OT Assessment  Patient does not need any further OT services    Follow Up Recommendations  No OT follow up    Barriers to Discharge      Equipment Recommendations  None recommended by OT    Recommendations for Other Services    Frequency       Precautions / Restrictions Precautions Precautions: Back Precaution Booklet Issued: Yes (comment) Required Braces or Orthoses: Spinal Brace Spinal Brace: Lumbar corset;Applied in sitting position Restrictions Weight Bearing Restrictions: No   Pertinent Vitals/Pain 7. Requested pain meds. nsg notified again    ADL  ADL Comments: Pt required mod A with LB ADL. With AE, pt mod I. Educated pt on back precautions and AE. Use of DME.     OT Diagnosis:    OT Problem List:   OT Treatment Interventions:     OT Goals(Current goals can be found in the care plan section)    Visit Information  Last OT Received On: 01/24/13 Assistance Needed: +1 History of Present Illness: s/p ARIF L2-L3; has had 3 previous back surgeries        Prior Functioning     Home Living Family/patient expects to be discharged to:: Private residence Living Arrangements: Parent Available Help at Discharge: Family;Available 24 hours/day Type of Home: House Home Access: Stairs to enter [employer] of Steps: 2 Entrance  Stairs-Rails: Can reach both Home Layout: One level Home Equipment: Walker - 2 wheels;Cane - single point;Shower seat;Other (comment) (handicap height toilet; ) Prior Function Level of Independence: Independent with assistive device(s) Communication Communication: HOH         Vision/Perception     Cognition  Cognition Arousal/Alertness: Awake/alert Behavior During Therapy: WFL for tasks assessed/performed Overall Cognitive Status: Within Functional Limits for tasks assessed    Extremity/Trunk Assessment Upper Extremity Assessment Upper Extremity Assessment: Overall WFL for tasks assessed Lower Extremity Assessment Lower Extremity Assessment: Overall WFL for tasks assessed Cervical / Trunk Assessment Cervical / Trunk Assessment: Other exceptions (fusion)     Mobility Bed Mobility Bed Mobility: Rolling Left;Left Sidelying to Sit;Sit to Sidelying Left Rolling Left: 5: Supervision Left Sidelying to Sit: HOB flat;5: Supervision Details for Bed Mobility Assistance: vc for proper technique Transfers Transfers: Sit to Stand;Stand to Sit Sit to Stand: 5: Supervision;From bed Stand to Sit: 5: Supervision;To bed Details for Transfer Assistance: supervision for safety and cues to maintain back precautions      Exercise     Balance Balance Balance Assessed:  (WFL for ADL)   End of Session OT - End of Session Equipment Utilized During Treatment: Back brace Activity Tolerance: Patient tolerated treatment well Patient left: in bed;with call bell/phone within reach Nurse Communication: Mobility status;Patient requests pain meds;Precautions  GO     Richard Horn,Richard Horn 01/24/2013, 7:03 PM [facility], OTR/L  [phone] 01/24/2013

## 2013-01-25 MED ORDER — OXYCODONE HCL 15 MG PO TABS: 15.0000 mg | ORAL_TABLET | ORAL | Status: DC | PRN

## 2013-01-25 NOTE — Progress Notes (Signed)
Physical Therapy Treatment Patient Details Name: Case Vassell MRN: [id number] DOB: 12/18/1960 Today's Date: 01/25/2013 Time: 0802-0819 PT Time Calculation (min): 17 min  PT Assessment / Plan / Recommendation  History of Present Illness s/p ARIF L2-L3; has had 3 previous back surgeries    PT Comments   Pt demo good safety awareness and is mod I for mobility and transfers today. Pt able to recall 3/3 back precautions independently. All education and goals completed/met. Safe from mobility standpoint to D/C home.   Follow Up Recommendations  No PT follow up;Supervision - Intermittent     Does the patient have the potential to tolerate intense rehabilitation     Barriers to Discharge        Equipment Recommendations  None recommended by PT    Recommendations for Other Services    Frequency Min 5X/week   Progress towards PT Goals Progress towards PT goals: Goals met/education completed, patient discharged from PT  Plan Current plan remains appropriate    Precautions / Restrictions Precautions Precautions: Back Precaution Comments: reviewed back precautions with pt; pt able to recall 3/3  Required Braces or Orthoses: Spinal Brace Spinal Brace: Lumbar corset;Applied in sitting position Restrictions Weight Bearing Restrictions: No   Pertinent Vitals/Pain Pt premedicated; with increased amb c/o 5/10 pain     Mobility  Bed Mobility Bed Mobility: Not assessed Details for Bed Mobility Assistance: pt refused to demo log roll at this time; stated he knew how to get in/out of bed and doesnt want to do it again  Transfers Transfers: Sit to Stand;Stand to Sit Sit to Stand: 6: Modified independent (Device/Increase time);From bed Stand to Sit: 6: Modified independent (Device/Increase time);To bed Details for Transfer Assistance: no physical (A) needed; required increased time; demo good technique  Ambulation/Gait Ambulation/Gait Assistance: 6: Modified independent (Device/Increase  time) Ambulation Distance (Feet): 600 Feet Assistive device: None Ambulation/Gait Assistance Details: pt more steady today; amb at good speed and demo good safety awareness with turns and dynamic tasks  Gait Pattern: Within Functional Limits Gait velocity: slightly decreased due to pain  Stairs: Yes Stairs Assistance: 6: Modified independent (Device/Increase time);5: Supervision Stairs Assistance Details (indicate cue type and reason): intial vc's for proper technique and safety Stair Management Technique: One rail Right;Step to pattern;Forwards Number of Stairs: 3 Wheelchair Mobility Wheelchair Mobility: No         PT Diagnosis:    PT Problem List:   PT Treatment Interventions:     PT Goals (current goals can now be found in the care plan section) Acute Rehab PT Goals Patient Stated Goal: home now. im ready to smoke a cigarette  PT Goal Formulation: With family Time For Goal Achievement: 01/31/13 Potential to Achieve Goals: Good  Visit Information  Last PT Received On: 01/25/13 Assistance Needed: +1 History of Present Illness: s/p ARIF L2-L3; has had 3 previous back surgeries     Subjective Data  Subjective: pt sitting EOB; stated "i know how to do the log roll, i aint doing it again. We can walk and do whatever"  Patient Stated Goal: home now. im ready to smoke a cigarette    Cognition  Cognition Arousal/Alertness: Awake/alert Behavior During Therapy: WFL for tasks assessed/performed Overall Cognitive Status: Within Functional Limits for tasks assessed    Balance  Balance Balance Assessed: Yes Static Sitting Balance Static Sitting - Balance Support: No upper extremity supported;Feet supported Static Sitting - Level of Assistance: 7: Independent Static Standing Balance Static Standing - Balance Support: No upper extremity supported;During  functional activity Static Standing - Level of Assistance: 7: Independent  End of Session PT - End of Session Equipment  Utilized During Treatment: Back brace Activity Tolerance: Patient tolerated treatment well Patient left: with call bell/phone within reach;in bed;Other (comment) (Sitting EOB) Nurse Communication: Mobility status   GP     Donell Sievert, [place] [phone] 01/25/2013, 8:25 AM

## 2013-01-25 NOTE — Discharge Summary (Signed)
Physician Discharge Summary  Patient ID: Richard Horn MRN: [id number] DOB/AGE: 1960-09-16 52 y.o.  Admit date: 01/23/2013 Discharge date: 01/25/2013  Admission Diagnoses:L2-3 herniated disc, disc degeneration, lumbago, lumbar radiculopathy  Discharge Diagnoses: the same Active Problems:   * No active hospital problems. *   Discharged Condition: good  Hospital Course: Dr. Yetta Barre performed an L2-3 interlaminar all arthrodesis and fusion on the patient with percutaneous pedicle screws on 01/23/2013.  The patient's postoperative course was unremarkable. He requested discharge to home on 01/25/2013. He was given oral and written discharge instructions. All his questions were answered.  Consults:None Significant Diagnostic Studies:none Treatments:L2-3 apex left with percutaneous pedicle screws Discharge Exam: Blood pressure 131/62, pulse 52, temperature 97.8 F (36.6 C), temperature source Oral, resp. rate 18, SpO2 95.00%. The patient is alert and pleasant. He looks well. His undergoing well. His lower extremity strength is grossly normal.  Disposition: home  Discharge Orders   Future Orders Complete By Expires     Call MD for:  difficulty breathing, headache or visual disturbances  As directed     Call MD for:  extreme fatigue  As directed     Call MD for:  hives  As directed     Call MD for:  persistant dizziness or light-headedness  As directed     Call MD for:  persistant nausea and vomiting  As directed     Call MD for:  redness, tenderness, or signs of infection (pain, swelling, redness, odor or green/yellow discharge around incision site)  As directed     Call MD for:  severe uncontrolled pain  As directed     Call MD for:  temperature >100.4  As directed     Diet - low sodium heart healthy  As directed     Discharge instructions  As directed     Comments:      Call [phone] for a followup appointment. Take a stool softener while you are using pain medications.    Driving  Restrictions  As directed     Comments:      Do not drive for 2 weeks.    Increase activity slowly  As directed     Lifting restrictions  As directed     Comments:      Do not lift more than 5 pounds. No excessive bending or twisting.    May shower / Bathe  As directed     Comments:      He may shower after the pain she is removed 3 days after surgery. Leave the incision alone.    No dressing needed  As directed         Medication List    STOP taking these medications       oxyCODONE 10 MG 12 hr tablet  Commonly known as:  OXYCONTIN  Replaced by:  oxyCODONE 15 MG immediate release tablet      TAKE these medications       ALPRAZolam 0.25 MG tablet  Commonly known as:  XANAX  Take 0.25 mg by mouth at bedtime as needed for sleep.     atorvastatin 10 MG tablet  Commonly known as:  LIPITOR  Take 10 mg by mouth daily.     citalopram 40 MG tablet  Commonly known as:  CELEXA  Take 40 mg by mouth daily.     gabapentin 800 MG tablet  Commonly known as:  NEURONTIN  Take 800 mg by mouth 3 (three) times daily.  lisinopril-hydrochlorothiazide 20-12.5 MG per tablet  Commonly known as:  PRINZIDE,ZESTORETIC  Take 1 tablet by mouth daily.     metFORMIN 500 MG 24 hr tablet  Commonly known as:  GLUCOPHAGE-XR  Take 500 mg by mouth daily with breakfast.     oxyCODONE 15 MG immediate release tablet  Commonly known as:  ROXICODONE  Take 1 tablet (15 mg total) by mouth every 4 (four) hours as needed.         SignedCristi Loron 01/25/2013, 10:33 AM

## 2013-01-26 LAB — GLUCOSE, CAPILLARY: Glucose-Capillary: 157 mg/dL — ABNORMAL HIGH (ref 70–99)

## 2013-01-27 ENCOUNTER — Encounter (HOSPITAL_COMMUNITY): Payer: Self-pay | Admitting: Neurological Surgery

## 2013-06-12 ENCOUNTER — Ambulatory Visit: Payer: Self-pay | Admitting: Neurological Surgery

## 2014-10-17 NOTE — Discharge Summary (Signed)
PATIENT NAME:  Richard Horn, Richard Horn MR#:  [id number] DATE OF BIRTH:  11/29/1960  DATE OF ADMISSION:  08/30/2011 DATE OF DISCHARGE:  08/31/2011  ADMITTING DIAGNOSIS: Lumbar spinal stenosis with degenerative disc disease L2-L3.   DISCHARGE DIAGNOSIS: Lumbar spinal stenosis with degenerative disc disease L2-L3.   PROCEDURE: L2-L3 partial hemilaminectomy, diskectomy, partial fasciectomy and foraminotomy L2-L3.   SURGEON: Dr. Winn JockJames C. Califf.  ASSISTANT: Evon Slackhomas C. Gaines, PA-C  ANESTHESIA: General.  ESTIMATED BLOOD LOSS: 275 mL.  REPLACEMENT: 1100 mL of crystalloid.   DRAINS: None.   COMPLICATIONS: None.   HISTORY: Patient had persistent radiating pain into his right leg that was unresponsive to conservative treatment with occasional left leg symptoms. Workup showed evidence of degenerative disc disease with severe stenosis at L2-L3, particularly foraminal stenosis. Options, risk, and benefits were discussed. At time of the procedure, there was marked compression particularly on the right side with a combination of facet joint hypertrophy and disk protrusion.   PHYSICAL EXAMINATION: GENERAL: Well developed, well nourished male, no apparent distress. CARDIAC: Normal. CHEST: Atraumatic. LUNGS: Clear. No wheezing, rales or rhonchi. ABDOMEN: Bowel sounds positive, soft, nondistended. CERVICAL SPINE: Slight muscular guarding and slight decreased range of motion. Cervical compression causes no radiating pain. THORACIC SPINE: Nontender. LUMBOSACRAL SPINE: Tender at the lumbosacral junction. There is significant guarding. Pain is primarily with extension. Decreased lateralization. BILATERAL UPPER EXTREMITIES: Shoulders, elbows, wrists and hands have good range of motion with normal neurovascular examination. RIGHT LOWER EXTREMITY: Decreased range of motion of the hip with decreased internal rotation. Heel and toe walk are difficult on the right side. LEFT LOWER EXTREMITY: Intact neurovascular examination with  no significant discomfort.   HOSPITAL COURSE: After initial admission on 08/30/2011 patient had surgery that same day. The patient had moderate pain control afterwards and was brought onto the orthopedic floor from the PAC-U. The patient was able to rest comfortably. Patient the next morning was able to ambulate in a moderate amount of pain. The patient was stable and ready to go home on 08/31/2011.   CONDITION ON DISCHARGE: Stable.   DISPOSITION: The patient was sent home.   DISCHARGE INSTRUCTIONS:  1. The [facility] at 1:45 p.m.  2. Patient is to take prescription medication oxycodone 5 mg tablets 1 to 2 tablets p.o. q.6 hours p.r.n. pain. He is to use additional ice as needed for comfort.  3. Diet: He is to resume a normal diet. Extra fruits and vegetables and fiber to prevent constipation.  4. Patient is to keep dressing on and we will remove dressing on the first postoperative [facility]. Patient is allowed to shower but he is to avoid soaking in bath, whirlpools, swimming pools.  5. Patient is to avoid any twisting, heavy activity.  DISCHARGE MEDICATIONS:  1. Hydrochlorothiazide/lisinopril 12.5/20 mg oral tablet 1 tablet orally once a day in the morning.  2. Venlafaxine 75 mg oral tablet 2 tablets daily in the morning.  3. Klor-Con 20 mEq oral powder for reconstitution 1 each orally b.i.Horn.  4. Oxycodone 1 tablet orally q.i.Horn. as needed. 5. Diazepam 5 mg oral tablet 1 tablet orally t.i.Horn. as needed.  ____________________________ Evon Slackhomas C. Gaines, PA-C tcg:cms Horn: 09/01/2011 08:08:30 ET T: 09/01/2011 15:43:49 ET  JOB#: [id number] Evon SlackHOMAS C GAINES PA ELECTRONICALLY SIGNED 09/17/2011 11:47

## 2014-10-17 NOTE — Op Note (Signed)
PATIENT NAME:  Richard MerinoSCOTT, [place]rad[place] D MR#:  [id number] DATE OF BIRTH:  1961/01/02  DATE OF PROCEDURE:  08/30/2011  PREOPERATIVE DIAGNOSIS: Lumbar spinal stenosis with degenerative disk disease L2-3.   POSTOPERATIVE DIAGNOSIS: Lumbar spinal stenosis with degenerative disk disease L2-3.   PROCEDURE: L2-3 partial hemilaminectomy, diskectomy, partial facetectomy and foraminotomy L2-3.   SURGEON: Winn JockJames C. Gerrit Heckaliff, MD   ASSISTANT: Cranston Neighborhris Gaines PA-C    ANESTHESIA: General.   ESTIMATED BLOOD LOSS: 275 mL.  REPLACEMENT: 1100 mL of Crystalloid.   DRAINS: None.   COMPLICATIONS: None.   BRIEF CLINICAL NOTE AND PATHOLOGY: The patient had persistent radiating pain into his right leg unresponsive to conservative treatment with occasional left leg symptoms. Work-up showed evidence of degenerative disk disease with severe stenosis at L2-3, particularly foraminal stenosis. Options, risks, and benefits were discussed. At time of the procedure, there was marked compression particularly on the right side by combination of facet joint hypertrophy and disk protrusion.   DESCRIPTION OF PROCEDURE: Preop antibiotics, adequate general anesthesia, prone position, all prominences well padded. Routine prepping and draping. Appropriate time-out was called.   Routine posterior incision was made with the guidance of a spinal needle and fluoroscopy. Subperiosteal dissection was performed bilaterally and self-retaining retractor was placed. Fluoroscopy again was used to confirm the appropriate level.   The microscope was then brought onto the field and with a combination of the bur and the 45 degree Kerrison the partial hemilaminotomy, foraminotomy, facetectomy, and diskectomy were performed bilaterally. The nerve roots were carefully protected and released. The incision was thoroughly irrigated throughout the procedure.   Hemostasis was obtained with the bipolar cautery. Incision was again thoroughly irrigated. The area was  covered with Surgiflo. Fascia was closed with #2 quill. 0 quill was used in subcutaneous tissue. Skin was closed with staples and skin glue. Soft sterile dressing was applied. Sponge and needle counts were reported as correct prior to and after wound closure. The patient was awakened and taken to the PAC-U having tolerated the procedure well.   ____________________________ Winn JockJames C. Gerrit Heckaliff, MD jcc:drc D: 08/31/2011 06:23:26 ET T: 08/31/2011 08:34:41 ET JOB#: [id number]  cc: Winn JockJames C. Gerrit Heckaliff, MD, <Dictator> Winn JockJAMES C Burley [place] MD ELECTRONICALLY SIGNED 09/04/2011 8:25

## 2015-03-18 IMAGING — RF DG MYELOGRAM LUMBAR
13 of 18 series · 13 of 18 positions shown · IV contrast (omnipaque)
Comparison: None.

MYELOGRAM INJECTION
TECHNIQUE: Informed consent was obtained from the patient prior to
the procedure, including potential complications of headache,
allergy, infection and pain. Specific instructions were given
regarding 24 hour bedrest post procedure to prevent post-LP
headache.  A timeout procedure was performed.  With the patient
prone, the lower back was prepped with Betadine.  1% Lidocaine was
used for local anesthesia.  Lumbar puncture was performed by the
radiologist at the L4 level using a 22 gauge needle with return of
clear CSF. I personally performed the lumbar puncture and
administered the intrathecal contrast. I also personally supervised
acquisition of the myelogram images. 15 cc of Omnipaque 180 was
injected into the subarachnoid space
CLINICAL DATA: Low back pain.  Severe right leg pain. Previous
lumbar surgery.
TECHNIQUE: Multidetector CT imaging of the lumbar spine was
performed following myelography.  Multiplanar CT image
reconstructions were also generated.

[Series 2: myelogram  white · 1 of 1 slices shown (1 of 10)]
[im 1/1]
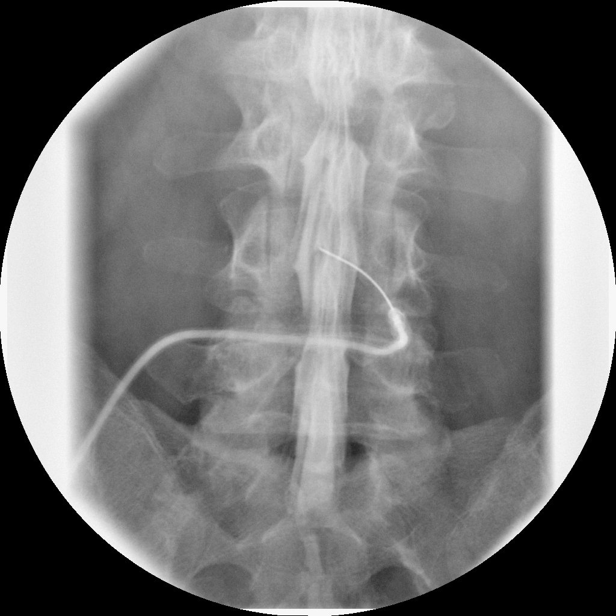

[Series 4: myelogram  white · 1 of 1 slices shown (2 of 10)]
[im 1/1]
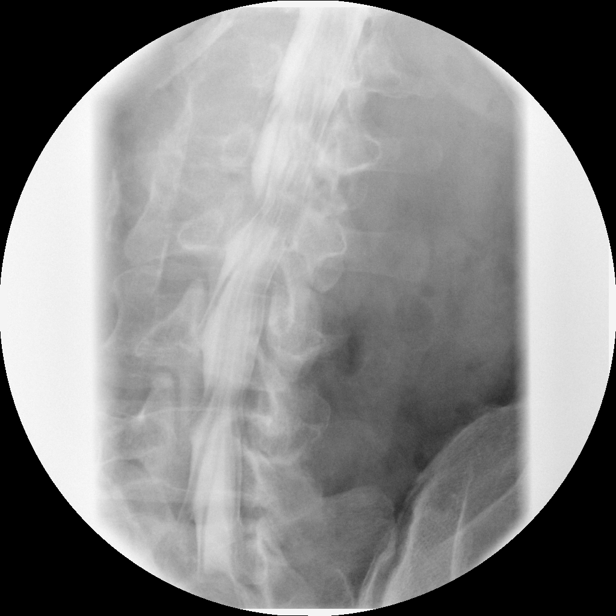

[Series 5: myelogram  white · 1 of 1 slices shown (3 of 10)]
[im 1/1]
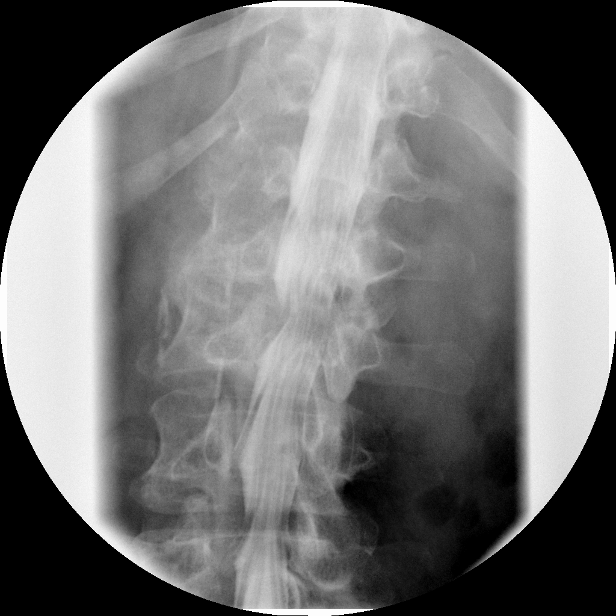

[Series 6: myelogram  white · 1 of 1 slices shown (4 of 10)]
[im 1/1]
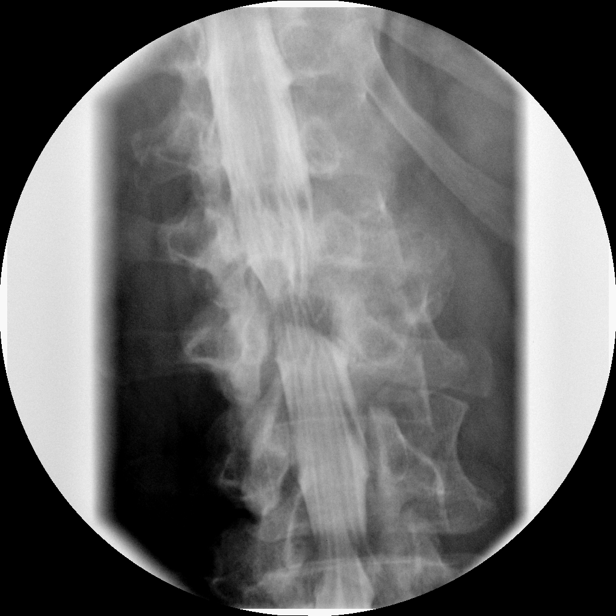

[Series 8: myelogram  white · 1 of 1 slices shown (5 of 10)]
[im 1/1]
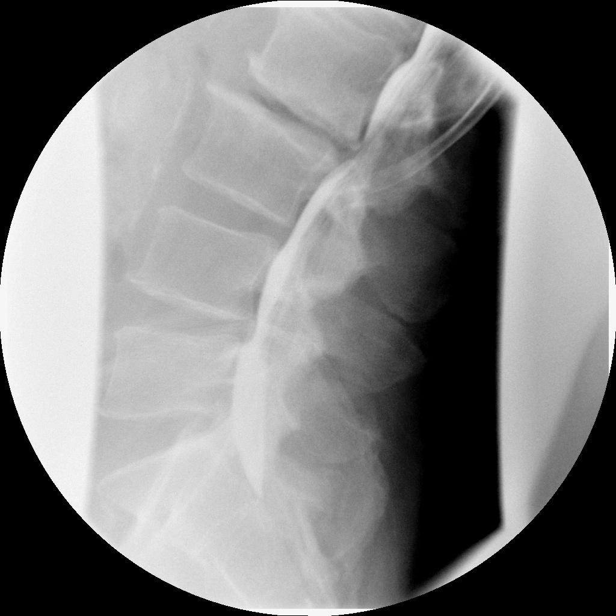

[Series 9: myelogram  white · 1 of 1 slices shown (6 of 10)]
[im 1/1]
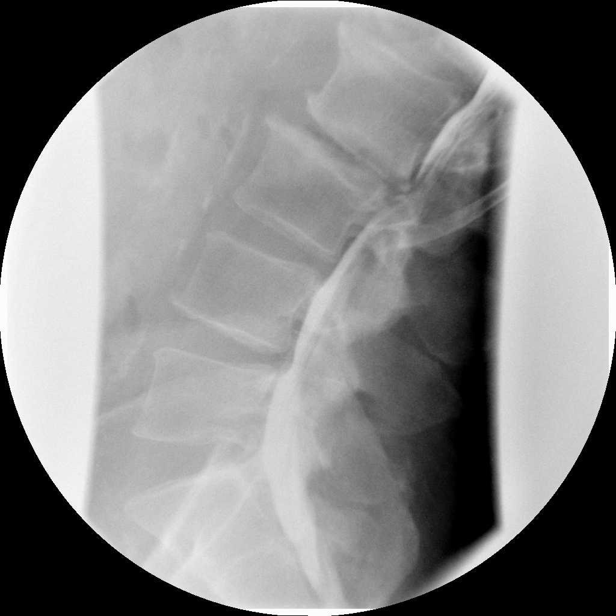

[Series 11: myelogram  white · 1 of 1 slices shown (7 of 10)]
[im 1/1]
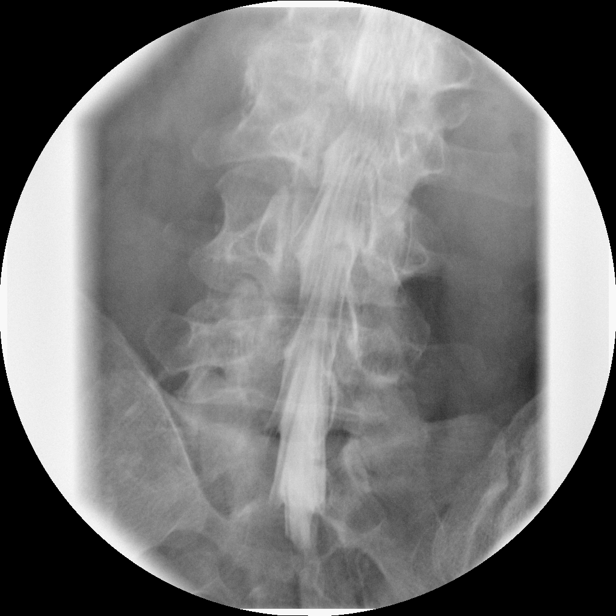

[Series 12: myelogram  white · 1 of 1 slices shown (8 of 10)]
[im 1/1]
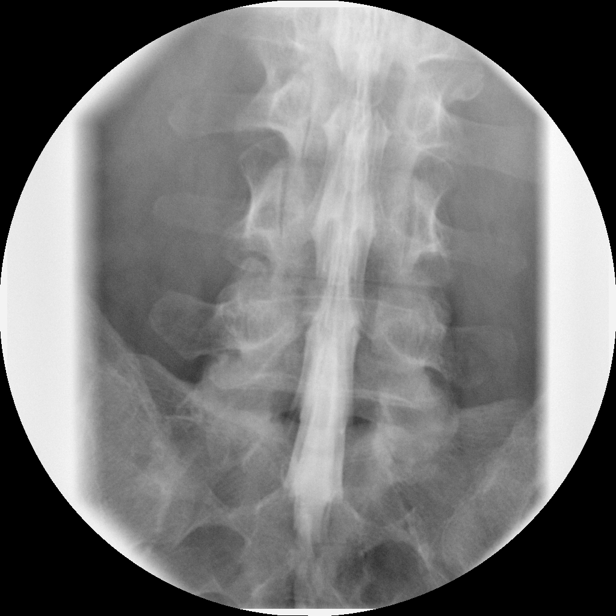

[Series 13: myelogram  white · 1 of 1 slices shown (9 of 10)]
[im 1/1]
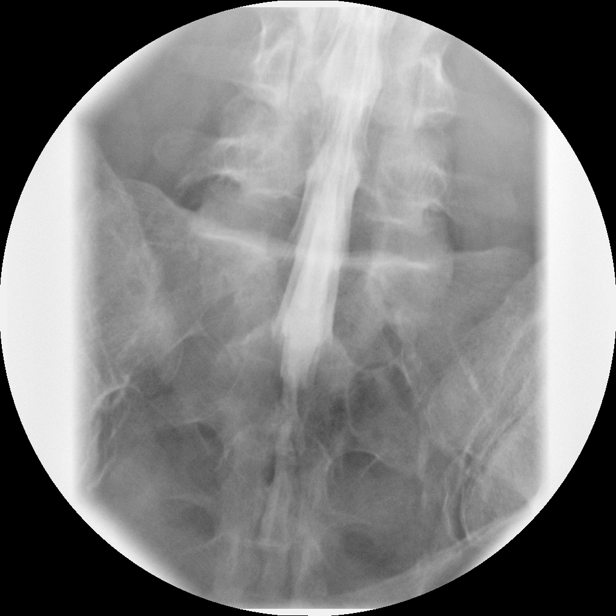

[Series 15: myelogram  white · 1 of 1 slices shown (10 of 10)]
[im 1/1]
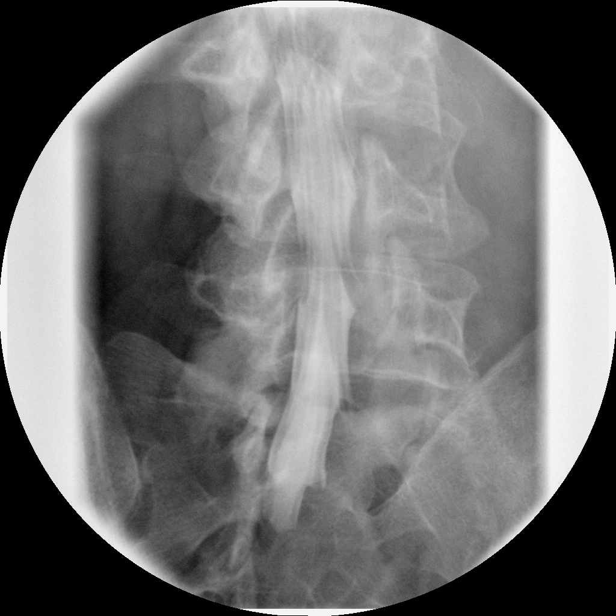

[Series 1001: view not recorded · 0.20mm/px · 1 of 1 slices shown (1 of 3)]
[im 1/1]
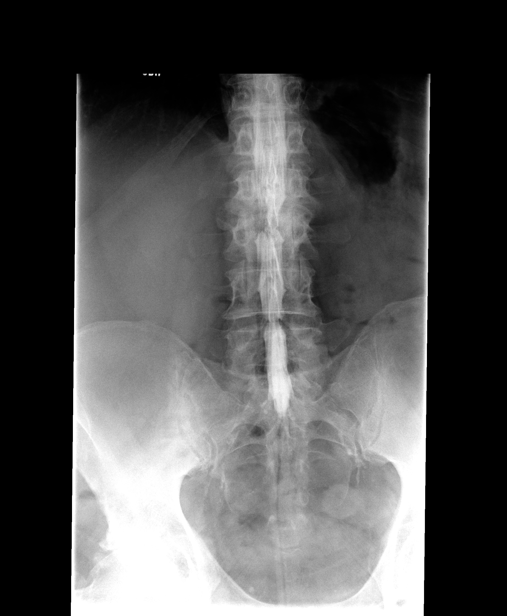

[Series 1002: view not recorded · 0.20mm/px · 1 of 1 slices shown (2 of 3)]
[im 1/1]
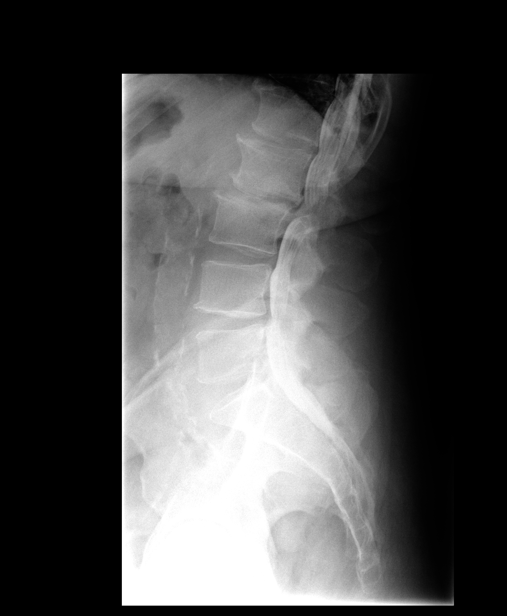

[Series 1004: view not recorded · 0.20mm/px · 1 of 1 slices shown (3 of 3)]
[im 1/1]
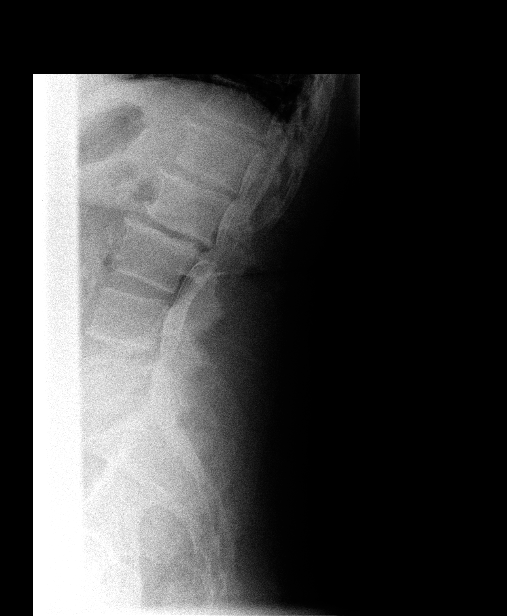

[13 of 18 positions shown; findings below may reference images not displayed]

IMPRESSION: Successful injection of  intrathecal contrast for myelography.

MYELOGRAM LUMBAR
FINDINGS: Good opacification lumbar subarachnoid space.  Mild waist
like narrowing L4-L5 with effacement of both L5 nerve roots.
Moderate 5 mm retrolisthesis with disc space narrowing L2-L3 with
severe stenosis and significant compression of the right greater
than left L3 nerve roots. 2 mm retrolisthesis L5 on S1.

With the patient standing, the stenosis at L2-3 becomes slightly
worse measuring 6 mm retrolisthesis along with increased right
greater than left L3 nerve root compression.  In flexion, the L2-L3
the retrolisthesis remains stable at 6 mm, but increases to 8 mm in
extension. There is no movement at L4-L5 or L5-S1..

Fluoroscopy Time: 1 minute 40 seconds
IMPRESSION: As above.

CT MYELOGRAPHY LUMBAR SPINE
FINDINGS: No  prevertebral or paraspinous masses.  Nonaneurysmal
atherosclerotic calcification of the aorta is present.  No
worrisome osseous lesions.  Conus medullaris slightly low ending at
mid body L2.

L1-2: Mild vacuum phenomenon.  Mild bulge.  Mild facet arthropathy.
No impingement.

L2-3: Right hemilaminectomy and partial facetectomy.  The
anterolisthesis measures   5 mm with the patient recumbent for CT.
There is residual central and rightward disc protrusion which
extends into the foramen.  Extraforaminal spurring is seen on the
right and left but worse on the right.  Moderate spinal stenosis is
present with right greater than left L3 nerve root impingement in
the canal.  Severe foraminal narrowing bilaterally affects the
right greater than left L2 nerve roots.  Vacuum phenomenon with
endplate reactive changes and  significant disc space narrowing but
no signs of infection.

L3-4: Normal disc space.  Mild facet arthropathy.  No impingement.

L4-5: Mild bulge.  Moderate facet and  ligamentum flavum
hypertrophy.  Mild central canal stenosis with slight effacement
both L5 nerve roots.

L5-S1: 2 mm facet mediated retrolisthesis.  Mild bulge.  Moderate
facet arthropathy.  No S1 nerve root impingement in the canal.
Bilateral L5 nerve root impingement in the foramen is likely.
IMPRESSION: The dominant right-sided abnormality is at L2-L3, where 5-6 mm of
L2 on L3 retrolisthesis, dynamic instability, recurrent central and
rightward protrusion, as well as extraforaminal spurring results in
moderate to severe spinal stenosis and right greater than left L2
and L3 nerve root impingement.  See discussion above.

Mild stenosis L4-L5 is multifactorial.

Mild retrolisthesis L5 on S1 is facet mediated. Bilateral foraminal
narrowing results in L5 nerve root impingement.

## 2019-03-03 ENCOUNTER — Other Ambulatory Visit: Payer: Self-pay

## 2019-03-03 DIAGNOSIS — Z79899 Other long term (current) drug therapy: Secondary | ICD-10-CM | POA: Insufficient documentation

## 2019-03-03 DIAGNOSIS — Z7689 Persons encountering health services in other specified circumstances: Secondary | ICD-10-CM | POA: Diagnosis not present

## 2019-03-03 DIAGNOSIS — F17210 Nicotine dependence, cigarettes, uncomplicated: Secondary | ICD-10-CM | POA: Insufficient documentation

## 2019-03-03 DIAGNOSIS — Z7984 Long term (current) use of oral hypoglycemic drugs: Secondary | ICD-10-CM | POA: Diagnosis not present

## 2019-03-03 DIAGNOSIS — I10 Essential (primary) hypertension: Secondary | ICD-10-CM | POA: Insufficient documentation

## 2019-03-03 DIAGNOSIS — E119 Type 2 diabetes mellitus without complications: Secondary | ICD-10-CM | POA: Diagnosis not present

## 2019-03-03 DIAGNOSIS — Z20828 Contact with and (suspected) exposure to other viral communicable diseases: Secondary | ICD-10-CM | POA: Insufficient documentation

## 2019-03-03 LAB — COMPREHENSIVE METABOLIC PANEL WITH GFR
ALT: 35 U/L (ref 0–44)
AST: 32 U/L (ref 15–41)
Albumin: 3.9 g/dL (ref 3.5–5.0)
Alkaline Phosphatase: 67 U/L (ref 38–126)
Anion gap: 11 (ref 5–15)
BUN: 14 mg/dL (ref 6–20)
CO2: 26 mmol/L (ref 22–32)
Calcium: 9.1 mg/dL (ref 8.9–10.3)
Chloride: 103 mmol/L (ref 98–111)
Creatinine, Ser: 0.76 mg/dL (ref 0.61–1.24)
GFR calc Af Amer: >60 mL/min
GFR calc non Af Amer: >60 mL/min
Glucose, Bld: 173 mg/dL — ABNORMAL HIGH (ref 70–99)
Potassium: 3 mmol/L — ABNORMAL LOW (ref 3.5–5.1)
Sodium: 140 mmol/L (ref 135–145)
Total Bilirubin: 0.7 mg/dL (ref 0.3–1.2)
Total Protein: 7.5 g/dL (ref 6.5–8.1)

## 2019-03-03 LAB — CBC WITH DIFFERENTIAL/PLATELET
Abs Immature Granulocytes: 0.05 K/uL (ref 0.00–0.07)
Basophils Absolute: 0.2 K/uL — ABNORMAL HIGH (ref 0.0–0.1)
Basophils Relative: 1 %
Eosinophils Absolute: 0.5 K/uL (ref 0.0–0.5)
Eosinophils Relative: 4 %
HCT: 45 % (ref 39.0–52.0)
Hemoglobin: 14.9 g/dL (ref 13.0–17.0)
Immature Granulocytes: 0 %
Lymphocytes Relative: 26 %
Lymphs Abs: 3.6 K/uL (ref 0.7–4.0)
MCH: 30.3 pg (ref 26.0–34.0)
MCHC: 33.1 g/dL (ref 30.0–36.0)
MCV: 91.6 fL (ref 80.0–100.0)
Monocytes Absolute: 1.4 K/uL — ABNORMAL HIGH (ref 0.1–1.0)
Monocytes Relative: 10 %
Neutro Abs: 8.1 K/uL — ABNORMAL HIGH (ref 1.7–7.7)
Neutrophils Relative %: 59 %
Platelets: 262 K/uL (ref 150–400)
RBC: 4.91 MIL/uL (ref 4.22–5.81)
RDW: 12.9 % (ref 11.5–15.5)
WBC: 13.9 K/uL — ABNORMAL HIGH (ref 4.0–10.5)
nRBC: 0 % (ref 0.0–0.2)

## 2019-03-03 LAB — ETHANOL: Alcohol, Ethyl (B): <10 mg/dL (ref <10)

## 2019-03-03 NOTE — ED Notes (Signed)
Contact person with DSS is Apolonio Schneiders [phone]. After 8am [phone].
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
## 2019-03-04 ENCOUNTER — Emergency Department
Admission: EM | Admit: 2019-03-04 | Discharge: 2019-03-04 | Disposition: A | Discharge status: Home / Self Care | Payer: Medicare PPO | Attending: Emergency Medicine | Admitting: Emergency Medicine

## 2019-03-04 ENCOUNTER — Emergency Department
Admission: EM | Admit: 2019-03-04 | Discharge: 2019-03-04 | Disposition: A | Discharge status: Home / Self Care | Payer: Medicare PPO

## 2019-03-04 DIAGNOSIS — Z789 Other specified health status: Secondary | ICD-10-CM

## 2019-03-04 LAB — SARS CORONAVIRUS 2 BY RT PCR (HOSPITAL ORDER, PERFORMED IN ~~LOC~~ HOSPITAL LAB): SARS Coronavirus 2: NEGATIVE

## 2019-03-04 NOTE — ED Notes (Signed)
Spoke with pt and he is becoming agitated with the time frame of his care. Pt stated "what is taking so long, you all can't keep me here". Also pt stated "I am a free man I should be able to go and smoke a cigarette". I informed the pt we were still waiting on a social work consult.
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
## 2019-03-04 NOTE — ED Notes (Signed)
Introduced myself to pt as his nurse for the day shift. Pt had finished eating and "stated it was good and he hadn't eaten in 3 days". 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 Pt stated "he only has two people left in his family and they have turned their back on him". Pt stated "he was homeless and didn't have anything". Patient NAD.

## 2019-03-04 NOTE — ED Notes (Addendum)
Pt was noted not to be in room - he was found outside in circle smoking - pt is severely agitated and states that he "is a free man and can do whatever I want to do, I do not have to follow any rules" - pt was advised that there was no smoking on the hospital grounds  - he stated "I will do what I want to do and if you would get off your ass and find me a place to live then I would get the hell out of here but until then I am going to smoke and I will leave the hospital grounds if I have to - All I have is cigarettes and tv" - he states "I had rather be on the streets dead than stuck in here" Pt was given breakfast tray with coke

## 2019-03-04 NOTE — Social Work (Addendum)
CSW was unable to contact DSS worker. CSW went to speak with patient and he was agitated and continued to state that he felt like a prisoner. Patient reported that he wanted to go to the homeless shelter.  CSW provided patient with bus pass, and address for the shelter.    [place], [place] ED  [phone]
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
## 2019-03-04 NOTE — ED Notes (Signed)
Attempted to call CSW for care update- no answer

## 2019-03-04 NOTE — ED Notes (Addendum)
Pt asleep on arrival, woke easily to answer questions

## 2019-03-04 NOTE — ED Notes (Signed)
I spoke with pt and informed him that he was free to leave at any time however encouraged him to stay because we were still waiting on the social work consult. Pt stated "I don't understand why I can't step outside, smoke and come back."

## 2019-03-04 NOTE — Social Work (Signed)
CSW attempted to call the DSS worker at [phone], and it went to the [place] main line. CSW asked the operator to get in contact with patient's DSS worker. CSW appeared to be transferred then the phone hung up.  CSW will continue to make attempts to reach DSS worker.    [place], [place] ED  [phone]

## 2019-09-21 ENCOUNTER — Emergency Department
Admission: EM | Admit: 2019-09-21 | Discharge: 2019-09-21 | Disposition: A | Discharge status: Home / Self Care | Payer: Medicare PPO | Attending: Student in an Organized Health Care Education/Training Program | Admitting: Student in an Organized Health Care Education/Training Program

## 2019-09-21 ENCOUNTER — Encounter: Payer: Self-pay | Admitting: Emergency Medicine

## 2019-09-21 ENCOUNTER — Emergency Department: Payer: Medicare PPO

## 2019-09-21 ENCOUNTER — Other Ambulatory Visit: Payer: Self-pay

## 2019-09-21 DIAGNOSIS — Y929 Unspecified place or not applicable: Secondary | ICD-10-CM | POA: Diagnosis not present

## 2019-09-21 DIAGNOSIS — S09301A Unspecified injury of right middle and inner ear, initial encounter: Secondary | ICD-10-CM | POA: Diagnosis present

## 2019-09-21 DIAGNOSIS — F17200 Nicotine dependence, unspecified, uncomplicated: Secondary | ICD-10-CM | POA: Diagnosis not present

## 2019-09-21 DIAGNOSIS — I10 Essential (primary) hypertension: Secondary | ICD-10-CM | POA: Diagnosis not present

## 2019-09-21 DIAGNOSIS — M5441 Lumbago with sciatica, right side: Secondary | ICD-10-CM | POA: Insufficient documentation

## 2019-09-21 DIAGNOSIS — Y9389 Activity, other specified: Secondary | ICD-10-CM | POA: Diagnosis not present

## 2019-09-21 DIAGNOSIS — Y999 Unspecified external cause status: Secondary | ICD-10-CM | POA: Insufficient documentation

## 2019-09-21 DIAGNOSIS — T161XXA Foreign body in right ear, initial encounter: Secondary | ICD-10-CM | POA: Insufficient documentation

## 2019-09-21 DIAGNOSIS — E119 Type 2 diabetes mellitus without complications: Secondary | ICD-10-CM | POA: Insufficient documentation

## 2019-09-21 DIAGNOSIS — M5136 Other intervertebral disc degeneration, lumbar region: Secondary | ICD-10-CM | POA: Diagnosis not present

## 2019-09-21 DIAGNOSIS — Z7984 Long term (current) use of oral hypoglycemic drugs: Secondary | ICD-10-CM | POA: Diagnosis not present

## 2019-09-21 DIAGNOSIS — X58XXXA Exposure to other specified factors, initial encounter: Secondary | ICD-10-CM | POA: Insufficient documentation

## 2019-09-21 LAB — CBC WITH DIFFERENTIAL/PLATELET
Abs Immature Granulocytes: 0.03 10*3/uL (ref 0.00–0.07)
Basophils Absolute: 0.1 10*3/uL (ref 0.0–0.1)
Basophils Relative: 1 %
Eosinophils Absolute: 0.3 10*3/uL (ref 0.0–0.5)
Eosinophils Relative: 2 %
HCT: 49.1 % (ref 39.0–52.0)
Hemoglobin: 16.4 g/dL (ref 13.0–17.0)
Immature Granulocytes: 0 %
Lymphocytes Relative: 25 %
Lymphs Abs: 2.8 10*3/uL (ref 0.7–4.0)
MCH: 30.4 pg (ref 26.0–34.0)
MCHC: 33.4 g/dL (ref 30.0–36.0)
MCV: 90.9 fL (ref 80.0–100.0)
Monocytes Absolute: 1.1 10*3/uL — ABNORMAL HIGH (ref 0.1–1.0)
Monocytes Relative: 10 %
Neutro Abs: 6.8 10*3/uL (ref 1.7–7.7)
Neutrophils Relative %: 62 %
Platelets: 299 10*3/uL (ref 150–400)
RBC: 5.4 MIL/uL (ref 4.22–5.81)
RDW: 12.8 % (ref 11.5–15.5)
WBC: 11.1 10*3/uL — ABNORMAL HIGH (ref 4.0–10.5)
nRBC: 0 % (ref 0.0–0.2)

## 2019-09-21 LAB — COMPREHENSIVE METABOLIC PANEL
ALT: 43 U/L (ref 0–44)
AST: 35 U/L (ref 15–41)
Albumin: 4 g/dL (ref 3.5–5.0)
Alkaline Phosphatase: 76 U/L (ref 38–126)
Anion gap: 11 (ref 5–15)
BUN: 8 mg/dL (ref 8–23)
CO2: 30 mmol/L (ref 22–32)
Calcium: 9.1 mg/dL (ref 8.9–10.3)
Chloride: 100 mmol/L (ref 98–111)
Creatinine, Ser: 0.64 mg/dL (ref 0.61–1.24)
GFR calc Af Amer: >60 mL/min (ref >60)
GFR calc non Af Amer: >60 mL/min (ref >60)
Glucose, Bld: 222 mg/dL — ABNORMAL HIGH (ref 70–99)
Potassium: 3.3 mmol/L — ABNORMAL LOW (ref 3.5–5.1)
Sodium: 141 mmol/L (ref 135–145)
Total Bilirubin: 0.5 mg/dL (ref 0.3–1.2)
Total Protein: 8 g/dL (ref 6.5–8.1)

## 2019-09-21 MED ORDER — TRAMADOL HCL 50 MG PO TABS
100.0000 mg | ORAL_TABLET | Freq: Once | ORAL | Status: AC
  Administered 2019-09-21: 100 mg via ORAL
  Filled 2019-09-21: qty 2

## 2019-09-21 MED ORDER — KETOROLAC TROMETHAMINE 30 MG/ML IJ SOLN
15.0000 mg | Freq: Once | INTRAMUSCULAR | Status: AC
  Administered 2019-09-21: 15 mg via INTRAVENOUS
  Filled 2019-09-21: qty 1

## 2019-09-21 MED ORDER — OXYCODONE HCL 5 MG PO TABS
10.0000 mg | ORAL_TABLET | ORAL | Status: DC | PRN
  Administered 2019-09-21: 10:00:00 10 mg via ORAL
  Filled 2019-09-21: qty 2

## 2019-09-21 MED ORDER — LISINOPRIL-HYDROCHLOROTHIAZIDE 20-12.5 MG PO TABS: 1.0000 | ORAL_TABLET | Freq: Every day | ORAL | 0 refills | Status: AC

## 2019-09-21 MED ORDER — LIDOCAINE HCL (PF) 1 % IJ SOLN
INTRAMUSCULAR | Status: AC
  Filled 2019-09-21: qty 5

## 2019-09-21 MED ORDER — GABAPENTIN 800 MG PO TABS: 800.0000 mg | ORAL_TABLET | Freq: Three times a day (TID) | ORAL | 1 refills | Status: AC

## 2019-09-21 MED ORDER — LIDOCAINE HCL (PF) 1 % IJ SOLN
5.0000 mL | Freq: Once | INTRAMUSCULAR | Status: AC
  Administered 2019-09-21: 09:00:00 5 mL via INTRADERMAL
  Filled 2019-09-21: qty 5

## 2019-09-21 MED ORDER — PREDNISONE 10 MG PO TABS: 10.0000 mg | ORAL_TABLET | Freq: Every day | ORAL | 0 refills | Status: AC

## 2019-09-21 MED ORDER — OXYCODONE HCL 5 MG PO TABS: 5.0000 mg | ORAL_TABLET | Freq: Three times a day (TID) | ORAL | 0 refills | Status: AC | PRN

## 2019-09-21 MED ORDER — LISINOPRIL 10 MG PO TABS
20.0000 mg | ORAL_TABLET | Freq: Once | ORAL | Status: AC
  Administered 2019-09-21: 20 mg via ORAL
  Filled 2019-09-21: qty 2

## 2019-09-21 MED ORDER — GABAPENTIN 400 MG PO CAPS
800.0000 mg | ORAL_CAPSULE | Freq: Once | ORAL | Status: AC
  Administered 2019-09-21: 800 mg via ORAL
  Filled 2019-09-21: qty 2

## 2019-09-21 MED ORDER — PREDNISONE 20 MG PO TABS
60.0000 mg | ORAL_TABLET | Freq: Once | ORAL | Status: AC
  Administered 2019-09-21: 11:00:00 60 mg via ORAL
  Filled 2019-09-21: qty 3

## 2019-09-21 NOTE — ED Triage Notes (Signed)
Patient presents to Emergency Department via [facility] from home with complaints of right posterior mid leg pain  EMS report received by April  History of right hip replacement  Right leg appears without redness or warmth CMS intact to distal

## 2019-09-21 NOTE — Discharge Instructions (Addendum)
You have been seen and diagnosed with back pain today in the Emergency Department.  Medicines: Please take the medicine as prescribed. Call your primary healthcare provider if you think your medicine is not working as expected or if you feel you need more. I do not generally provide refills on pain medications in the ED because I don't monitor you on a long-term basis. Your primary care doctor does. Do not drive or operate heavy machinery while taking narcotic pain medications.  Self-care: Exercise: Gentle exercise may help decrease your pain. Start with some light exercises such as walking, biking, or swimming during the first 2 weeks. Ask for more information about the activities or exercises that are right for you. Maintain a healthy weight.  Ice: Ice helps decrease swelling, pain, and muscle spasm. Put crushed ice in a plastic bag. Cover it with a towel. Place it on your lower back for 20 to 30 minutes every 2 hours until improvement.  Heat: Heat helps decrease pain and muscle spasms. Use a small towel dampened with warm water or a heat pad, or sit in a warm bath. Apply heat on the area for 20 to 30 minutes every 2 hours until improvement. Alternate heat and ice.  Physical therapy: You may need to see a physical therapist to teach you special exercises. These exercises help improve movement and decrease pain. Physical therapy can also help improve strength and decrease your risk for loss of function. Please follow-up with your PCP about physical therapy.  Contact your primary healthcare provider or orthopedist if:  You have a fever.  You have pain at night or when you rest.  Your pain does not get better with treatment.  You have pain that worsens when you cough, sneeze, or strain your back.  You suddenly feel something pop or snap in your back.  You have questions or concerns about your condition or care.  Please return to the emergency department if:  You have severe pain.  You have  sudden stiffness or heaviness to buttocks down to both legs.  You have numbness or weakness in one leg, or pain in both legs.  You have numbness in your genital area or across your lower back.  You cannot control your urine or bowel movements. TECHNIQUE: Multiplanar, multisequence MR imaging of the lumbar spine was performed. No intravenous contrast was administered.   COMPARISON:  Lumbar radiographs dated 05/18/2013, CT myelogram dated 12/17/2012 and lumbar MRI dated 10/23/2011   FINDINGS: Segmentation:  5 typical lumbar segments.   Alignment:  Physiologic.   Vertebrae: Previous interbody and posterior fusion at L2-3. No discitis. No fractures.   Conus medullaris: Conus extends to the L2-3 level.   Paraspinal and other soft tissues: Negative.   Disc levels:   T12-L1: Progressive disc degeneration. Tiny broad-based disc bulge with no neural impingement. Increased disc space narrowing with degenerative changes of the vertebral endplates which are new since 2013.   L1-2: Disc space narrowing with disc desiccation. Small broad-based disc bulge without neural impingement, new since the prior study.   L2-3: Interval interbody and posterior fusion with posterior decompression. No residual neural impingement. Excellent posterior decompression of the thecal sac. The nerve roots are redundant in the thecal sac at this level due to more distal compression of the thecal sac.   L3-4: New severe degenerative disc disease with marked disc space narrowing with a small broad-based disc protrusion with asymmetric osteophytes to the left extending into the left neural foramen creating left  foraminal stenosis. Hypertrophy of the ligamentum flavum and facet joints combine with epidural fat to severely compress the thecal sac without focal neural impingement. This is all new since the prior study.   L4-5: New severe degenerative disc disease with disc space narrowing with a small  broad-based disc protrusion with accompanying osteophytes extending into both neural foramina creating moderate bilateral foraminal stenosis. Hypertrophy of the ligamentum flavum and facet joints combine to create new symmetrical severe compression of the thecal sac without focal neural impingement. There is edema in the right pedicle and right transverse process of L4 likely due to the adjacent facet arthritis.   L5-S1: Minimal new broad-based disc bulge with a prominent bulge into the inferior aspect of the left neural foramen. Progressive moderately severe facet arthritis creating severe right and moderately severe left foraminal stenosis.   IMPRESSION: 1. New severe compression of the thecal sac at L3-4 and L4-5 due to hypertrophy of the ligamentum flavum and facet joints, epidural fat and small broad-based disc protrusions with accompanying osteophytes. The nerve roots are trapped at these levels as indicated by redundancy of the nerve roots at L2-3. 2. Progressive severe right foraminal stenosis at L5-S1. 3. Excellent decompression of the thecal sac at L2-3     Electronically Signed   By: Francene Boyers M.D.   On: 09/21/2019 09:34

## 2019-09-21 NOTE — ED Provider Notes (Signed)
[facility] Emergency Department Provider Note    First MD Initiated Contact with Patient 09/21/19 [phone]     (approximate)  I have reviewed the triage vital signs and the nursing notes.   HISTORY  Chief Complaint Leg Pain    HPI Richard Horn is a 59 y.o. male with the below listed past medical history not currently taking any medications presents to the ER for evaluation of several days of worsening intermittent right leg pain describes it as a burning and aching sensation shooting down to his feet.  Does have extensive history as arthritis as well as chronic back pain.  Denies any swelling.  No fevers.  No saddle anesthesia or urinary retention.  States he had similar symptoms 1 year ago and was started on Neurontin with improvement of symptoms.  States he does have a history of chronic neuropathy.  Denies any recent trauma.  No abdominal pain.  No nausea or vomiting.    Past Medical History:  Diagnosis Date  . Arthritis   . Chronic back pain   . Depression    takes Celexa daily  . Diabetes mellitus without complication (HCC)    takes Metformin daily  . History of gout    no meds required  . Hyperlipidemia    takes Atorvastatin daily  . Hypertension    takes Prinzide daily  . Insomnia    takes Xanax prn   History reviewed. No pertinent family history. Past Surgical History:  Procedure Laterality Date  . ANTERIOR LAT LUMBAR FUSION N/A 01/23/2013   Procedure: Lumbar two-three Extreme Lumbar Interbody Fusion with Percutaneous Pedicle Screws;  Surgeon: Tia Alert, MD;  Location: [facility];  Service: Neurosurgery;  Laterality: N/A;  . BACK SURGERY      x 3  . LUMBAR PERCUTANEOUS PEDICLE SCREW 1 LEVEL N/A 01/23/2013   Procedure: LUMBAR PERCUTANEOUS PEDICLE SCREW 1 LEVEL;  Surgeon: Tia Alert, MD;  Location: [facility];  Service: Neurosurgery;  Laterality: N/A;  . neck fusion     There are no problems to display for this  patient.     Prior to Admission medications   Medication Sig Start Date End Date Taking? Authorizing Provider  ALPRAZolam Prudy Feeler) 0.25 MG tablet Take 0.25 mg by mouth at bedtime as needed for sleep.    [provider]  atorvastatin (LIPITOR) 10 MG tablet Take 10 mg by mouth daily.    [provider]  citalopram (CELEXA) 40 MG tablet Take 40 mg by mouth daily.    [provider]  gabapentin (NEURONTIN) 800 MG tablet Take 1 tablet (800 mg total) by mouth 3 (three) times daily. 09/21/19   Willy Eddy, MD  lisinopril-hydrochlorothiazide (ZESTORETIC) 20-12.5 MG tablet Take 1 tablet by mouth daily. 09/21/19   Willy Eddy, MD  metFORMIN (GLUCOPHAGE-XR) 500 MG 24 hr tablet Take 500 mg by mouth daily with breakfast.    [provider]  oxyCODONE (ROXICODONE) 5 MG immediate release tablet Take 1 tablet (5 mg total) by mouth every 8 (eight) hours as needed. 09/21/19 09/20/20  Willy Eddy, MD  predniSONE (DELTASONE) 10 MG tablet Take 1 tablet (10 mg total) by mouth daily. Day 1-2: Take 50 mg  ( 5 pills) Day 3-4 : Take 40 mg (4pills) Day 5-6: Take 30 mg (3 pills) Day 7-8:  Take 20 mg (2 pills) Day 9:  Take 10mg  (1 pill) 09/21/19   09/23/19, MD    Allergies Cyclobenzaprine, Fentanyl, Other, Adhesive [  tape], Codeine, and Hydrocodone-acetaminophen    Social History Social History   Tobacco Use  . Smoking status: Current Every Day Smoker    Packs/day: 1.00    Years: 40.00    Pack years: 40.00  . Smokeless tobacco: Never Used  Substance Use Topics  . Alcohol use: No  . Drug use: No    Review of Systems Patient denies headaches, rhinorrhea, blurry vision, numbness, shortness of breath, chest pain, edema, cough, abdominal pain, nausea, vomiting, diarrhea, dysuria, fevers, rashes or hallucinations unless otherwise stated above in HPI. ____________________________________________   PHYSICAL EXAM:  VITAL SIGNS: Vitals:   09/21/19 0930  09/21/19 1059  BP: (!) 163/73 (!) 165/85  Pulse: 65 75  Resp: 18 19  Temp:    SpO2: 96% 100%    Constitutional: Alert and oriented.  Eyes: Conjunctivae are normal.  Head: Atraumatic. Nose: No congestion/rhinnorhea. Mouth/Throat: Mucous membranes are moist.   Neck: No stridor. Painless ROM.  Cardiovascular: Normal rate, regular rhythm. Grossly normal heart sounds.  Good peripheral circulation. Respiratory: Normal respiratory effort.  No retractions. Lungs CTAB. Gastrointestinal: Soft and nontender. No distention. No abdominal bruits. No CVA tenderness. Genitourinary:  Musculoskeletal: No lower extremity tenderness nor edema. Strong PT and DP pulses to BLE.  No joint effusions. decreased sensation to bottom of RLE.  Down going babinski Neurologic:  Normal speech and language. No gross focal neurologic deficits are appreciated. No facial droop Skin:  Skin is warm, dry and intact. No rash noted. Psychiatric: Mood and affect are normal. Speech and behavior are normal.  ____________________________________________   LABS (all labs ordered are listed, but only abnormal results are displayed)  Results for orders placed or performed during the hospital encounter of 09/21/19 (from the past 24 hour(s))  CBC with Differential/Platelet     Status: Abnormal   Collection Time: 09/21/19  7:43 AM  Result Value Ref Range   WBC 11.1 (H) 4.0 - 10.5 K/uL   RBC 5.40 4.22 - 5.81 MIL/uL   Hemoglobin 16.4 13.0 - 17.0 g/dL   HCT 57.8 46.9 - 62.9 %   MCV 90.9 80.0 - 100.0 fL   MCH 30.4 26.0 - 34.0 pg   MCHC 33.4 30.0 - 36.0 g/dL   RDW 52.8 41.3 - 24.4 %   Platelets 299 150 - 400 K/uL   nRBC 0.0 0.0 - 0.2 %   Neutrophils Relative % 62 %   Neutro Abs 6.8 1.7 - 7.7 K/uL   Lymphocytes Relative 25 %   Lymphs Abs 2.8 0.7 - 4.0 K/uL   Monocytes Relative 10 %   Monocytes Absolute 1.1 (H) 0.1 - 1.0 K/uL   Eosinophils Relative 2 %   Eosinophils Absolute 0.3 0.0 - 0.5 K/uL   Basophils Relative 1 %    Basophils Absolute 0.1 0.0 - 0.1 K/uL   Immature Granulocytes 0 %   Abs Immature Granulocytes 0.03 0.00 - 0.07 K/uL  Comprehensive metabolic panel     Status: Abnormal   Collection Time: 09/21/19  7:43 AM  Result Value Ref Range   Sodium 141 135 - 145 mmol/L   Potassium 3.3 (L) 3.5 - 5.1 mmol/L   Chloride 100 98 - 111 mmol/L   CO2 30 22 - 32 mmol/L   Glucose, Bld 222 (H) 70 - 99 mg/dL   BUN 8 8 - 23 mg/dL   Creatinine, Ser 0.10 0.61 - 1.24 mg/dL   Calcium 9.1 8.9 - 27.2 mg/dL   Total Protein 8.0 6.5 - 8.1 g/dL  Albumin 4.0 3.5 - 5.0 g/dL   AST 35 15 - 41 U/L   ALT 43 0 - 44 U/L   Alkaline Phosphatase 76 38 - 126 U/L   Total Bilirubin 0.5 0.3 - 1.2 mg/dL   GFR calc non Af Amer >60 >60 mL/min   GFR calc Af Amer >60 >60 mL/min   Anion gap 11 5 - 15   ____________________________________________  EKG My review and personal interpretation at Time: 8:08   Indication: back pain  Rate: 70  Rhythm: sinus Axis: normal Other: motion artifact,  Nonspecific st abn, no stemi ____________________________________________  RADIOLOGY  I personally reviewed all radiographic images ordered to evaluate for the above acute complaints and reviewed radiology reports and findings.  These findings were personally discussed with the patient.  Please see medical record for radiology report.  ____________________________________________   PROCEDURES  Procedure(s) performed:  .Foreign Body Removal  Date/Time: 09/21/2019 11:50 AM Performed by: Merlyn Lot, MD Authorized by: Merlyn Lot, MD  Consent: Verbal consent obtained. Consent given by: patient Body area: ear Location details: right ear Anesthesia: see MAR for details  Anesthesia: Local Anesthetic: lidocaine 1% without epinephrine Localization method: visualized and magnification Removal mechanism: suction Complexity: simple 1 objects recovered. Objects recovered: cotton Post-procedure assessment: foreign body  removed      Critical Care performed: no ____________________________________________   INITIAL IMPRESSION / ASSESSMENT AND PLAN / ED COURSE  Pertinent labs & imaging results that were available during my care of the patient were reviewed by me and considered in my medical decision making (see chart for details).   DDX: fracture, contusion, ddd, ce, ss, sciatica, retained foreign body  AHMAAD NEIDHARDT is a 59 y.o. who presents to the ED with 3 days described above.  Patient nontoxic-appearing.  Does have extensive past medical history regarding previous lumbar fusion surgeries.  Does have foreign body in the right ear as well which will be removed.  Patient describing some worsening weakness of the right leg and sensory deficits that are new.  Given his history will order imaging.  Clinical Course as of Sep 21 1150  Mon Sep 21, 2019  0932 Foreign body in the right external auditory canal was removed with suction.  Foreign body removed was a piece of cotton consistent with the top of the cotton swab.  No evidence of TM rupture or injury.  No evidence of effusion or purulence.   [PR]  1042 Patient has been evaluated by Dr. Lacinda Axon of neurosurgery at bedside.  Given his reassuring exam will be appropriate for outpatient follow-up and management.  Will put him on a steroid taper.   [PR]    Clinical Course User Index [PR] Merlyn Lot, MD    The patient was evaluated in Emergency Department today for the symptoms described in the history of present illness. He/she was evaluated in the context of the global COVID-19 pandemic, which necessitated consideration that the patient might be at risk for infection with the SARS-CoV-2 virus that causes COVID-19. Institutional protocols and algorithms that pertain to the evaluation of patients at risk for COVID-19 are in a state of rapid change based on information released by regulatory bodies including the CDC and federal and state organizations. These  policies and algorithms were followed during the patient's care in the ED.  As part of my medical decision making, I reviewed the following data within the [place] notes reviewed and incorporated, Labs reviewed, notes from prior ED visits and [place]  Controlled Substance Database   ____________________________________________   FINAL CLINICAL IMPRESSION(S) / ED DIAGNOSES  Final diagnoses:  Acute right-sided low back pain with right-sided sciatica  Degenerative disc disease, lumbar  Foreign body in auditory canal, right, initial encounter      NEW MEDICATIONS STARTED DURING THIS VISIT:  Discharge Medication List as of 09/21/2019 10:57 AM    START taking these medications   Details  oxyCODONE (ROXICODONE) 5 MG immediate release tablet Take 1 tablet (5 mg total) by mouth every 8 (eight) hours as needed., Starting Mon 09/21/2019, Until Tue 09/20/2020, Normal    predniSONE (DELTASONE) 10 MG tablet Take 1 tablet (10 mg total) by mouth daily. Day 1-2: Take 50 mg  ( 5 pills) Day 3-4 : Take 40 mg (4pills) Day 5-6: Take 30 mg (3 pills) Day 7-8:  Take 20 mg (2 pills) Day 9:  Take 10mg  (1 pill), Starting Mon 09/21/2019, Normal         Note:  This document was prepared using Dragon voice recognition software and may include unintentional dictation errors.    09/23/2019, MD 09/21/19 1152

## 2019-09-21 NOTE — ED Notes (Signed)
Pt reveals pain goes from right hip to right foot, pt uses cane, pt reports taking no meds

## 2019-09-21 NOTE — ED Notes (Signed)
Pt taken to xray 

## 2019-09-21 NOTE — ED Notes (Signed)
Pt back from X-ray.  

## 2019-09-21 NOTE — ED Notes (Signed)
Pt gone to MRI 

## 2019-09-22 NOTE — Consult Note (Signed)
Neurosurgery-New Consultation Evaluation 09/22/2019 Richard Horn [id number]  Identifying Statement: Richard Horn is a 59 y.o. male from [place] [address] with back and leg pain  Physician Requesting Consultation: [place] [facility]  History of Present Illness: Richard Horn is here for evaluation of back pain and leg pain that he says started in the last week.  He states he is very active and walking but the current pain is limiting him some.  He does have a history of a prior L2-3 fusion.  He has not had any treatment for this pain over the past week.  He denies any weakness or numbness in his legs.  He does describe pain going into both legs, especially when he is walking.  He does not endorse any bowel or bladder symptoms.  He did get an MRI of the lumbar spine and neurosurgery is consulted for treatment options.  Past Medical History:  Past Medical History:  Diagnosis Date  . Arthritis   . Chronic back pain   . Depression    takes Celexa daily  . Diabetes mellitus without complication ([place])    takes Metformin daily  . History of gout    no meds required  . Hyperlipidemia    takes Atorvastatin daily  . Hypertension    takes Prinzide daily  . Insomnia    takes Xanax prn    Social History: Social History   Socioeconomic History  . Marital status: Married    Spouse name: Not on file  . Number of children: Not on file  . Years of education: Not on file  . Highest education level: Not on file  Occupational History  . Not on file  Tobacco Use  . Smoking status: Current Every Day Smoker    Packs/day: 1.00    Years: 40.00    Pack years: 40.00  . Smokeless tobacco: Never Used  Substance and Sexual Activity  . Alcohol use: No  . Drug use: No  . Sexual activity: Never  Other Topics Concern  . Not on file  Social History Narrative  . Not on file   Social Determinants of Health   Financial Resource Strain:   . Difficulty of Paying Living Expenses:    Food Insecurity:   . Worried About Charity fundraiser in the Last Year:   . Arboriculturist in the Last Year:   Transportation Needs:   . Film/video editor (Medical):   Marland Kitchen Lack of Transportation (Non-Medical):   Physical Activity:   . Days of Exercise per Week:   . Minutes of Exercise per Session:   Stress:   . Feeling of Stress :   Social Connections:   . Frequency of Communication with Friends and Family:   . Frequency of Social Gatherings with Friends and Family:   . Attends Religious Services:   . Active Member of Clubs or Organizations:   . Attends Archivist Meetings:   Marland Kitchen Marital Status:   Intimate Partner Violence:   . Fear of Current or Ex-Partner:   . Emotionally Abused:   Marland Kitchen Physically Abused:   . Sexually Abused:    Living arrangements (living alone, with partner): He resides in [place] History: History reviewed. No pertinent family history.  Review of Systems:  Review of Systems - General ROS: Negative Psychological ROS: Negative Ophthalmic ROS: Negative ENT ROS: Negative Hematological and Lymphatic ROS: Negative  Endocrine ROS: Negative Respiratory ROS: Negative Cardiovascular ROS: Negative Gastrointestinal ROS:  Negative Genito-Urinary ROS: Negative Musculoskeletal ROS: Positive for back pain Neurological ROS: Negative for weakness or numbness Dermatological ROS: Negative  Physical Exam: BP (!) 165/85   Pulse 75   Temp 98.2 F (36.8 C) (Oral)   Resp 19   Ht 5\' 9"  (1.753 m)   Wt 90.7 kg   SpO2 100%   BMI 29.53 kg/m  Body mass index is 29.53 kg/m. Body surface area is 2.1 meters squared. General appearance: Alert, cooperative, in no acute distress Head: Normocephalic, atraumatic Eyes: Normal, EOM intact Oropharynx: Wearing facemask Ext: No edema in LE bilaterally  Neurologic exam:  Mental status: alertness: alert,  affect: normal Speech: fluent and clear Motor:strength symmetric 5/5 in bilateral hip flexion,  knee flexion, knee extension, dorsiflexion and plantarflexion Sensory: intact to light touch in bilateral lower extremities Gait: normal   Laboratory: Results for orders placed or performed during the hospital encounter of 09/21/19  CBC with Differential/Platelet  Result Value Ref Range   WBC 11.1 (H) 4.0 - 10.5 K/uL   RBC 5.40 4.22 - 5.81 MIL/uL   Hemoglobin 16.4 13.0 - 17.0 g/dL   HCT 09/23/19 35.8 - 25.1 %   MCV 90.9 80.0 - 100.0 fL   MCH 30.4 26.0 - 34.0 pg   MCHC 33.4 30.0 - 36.0 g/dL   RDW 89.8 42.1 - 03.1 %   Platelets 299 150 - 400 K/uL   nRBC 0.0 0.0 - 0.2 %   Neutrophils Relative % 62 %   Neutro Abs 6.8 1.7 - 7.7 K/uL   Lymphocytes Relative 25 %   Lymphs Abs 2.8 0.7 - 4.0 K/uL   Monocytes Relative 10 %   Monocytes Absolute 1.1 (H) 0.1 - 1.0 K/uL   Eosinophils Relative 2 %   Eosinophils Absolute 0.3 0.0 - 0.5 K/uL   Basophils Relative 1 %   Basophils Absolute 0.1 0.0 - 0.1 K/uL   Immature Granulocytes 0 %   Abs Immature Granulocytes 0.03 0.00 - 0.07 K/uL  Comprehensive metabolic panel  Result Value Ref Range   Sodium 141 135 - 145 mmol/L   Potassium 3.3 (L) 3.5 - 5.1 mmol/L   Chloride 100 98 - 111 mmol/L   CO2 30 22 - 32 mmol/L   Glucose, Bld 222 (H) 70 - 99 mg/dL   BUN 8 8 - 23 mg/dL   Creatinine, Ser 28.1 0.61 - 1.24 mg/dL   Calcium 9.1 8.9 - 1.88 mg/dL   Total Protein 8.0 6.5 - 8.1 g/dL   Albumin 4.0 3.5 - 5.0 g/dL   AST 35 15 - 41 U/L   ALT 43 0 - 44 U/L   Alkaline Phosphatase 76 38 - 126 U/L   Total Bilirubin 0.5 0.3 - 1.2 mg/dL   GFR calc non Af Amer >60 >60 mL/min   GFR calc Af Amer >60 >60 mL/min   Anion gap 11 5 - 15   I personally reviewed labs  Imaging: MRI lumbar spine:1. New severe compression of the thecal sac at L3-4 and L4-5 due to hypertrophy of the ligamentum flavum and facet joints, epidural fat and small broad-based disc protrusions with accompanying osteophytes. The nerve roots are trapped at these levels as indicated by redundancy of  the nerve roots at L2-3. 2. Progressive severe right foraminal stenosis at L5-S1. 3. Excellent decompression of the thecal sac at L2-3   Impression/Plan:  Richard Horn is here for evaluation of acute symptoms of back and leg pain.  His exam is reassuring and I did review  with him that the MRI does show some severe stenosis below his prior fusion.  This could be contributing to his pain but given no concerning findings on his exam, I do recommend a conservative approach first.  I would recommend physical therapy and consideration of pain treatment including medications and an ESI.  Acutely, I do think a steroid taper is warranted.  He states that he would like to have his care provider closer to his home and encouraged him to follow-up as needed.   1.  Diagnosis: Lumbar stenosis  2.  Plan -Can see as an outpatient in clinic
